# Patient Record
Sex: Female | Born: 1990 | Race: White | Hispanic: No | Marital: Married | Smoking: Current some day smoker
Health system: Southern US, Community
[De-identification: ages and names within clinical notes are randomized; demographics above are authoritative.]

## PROBLEM LIST (undated history)

## (undated) DIAGNOSIS — F4310 Post-traumatic stress disorder, unspecified: Secondary | ICD-10-CM

## (undated) DIAGNOSIS — F909 Attention-deficit hyperactivity disorder, unspecified type: Secondary | ICD-10-CM

## (undated) DIAGNOSIS — F1911 Other psychoactive substance abuse, in remission: Secondary | ICD-10-CM

## (undated) HISTORY — DX: Post-traumatic stress disorder, unspecified: F43.10

## (undated) HISTORY — DX: Attention-deficit hyperactivity disorder, unspecified type: F90.9

## (undated) HISTORY — DX: Other psychoactive substance abuse, in remission: F19.11

---
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
## 2014-08-07 HISTORY — PX: HAND SURGERY: SHX662

## 2016-08-07 HISTORY — PX: WISDOM TOOTH EXTRACTION: SHX21

## 2018-06-28 LAB — CHG URINE PREGNANCY TEST VISUAL COLOR CMPRSN METHS: Preg Test, Ur: POSITIVE

## 2018-08-05 LAB — OB RESULTS CONSOLE HEPATITIS B SURFACE ANTIGEN
Hepatitis B Surface Ag: NEGATIVE
Hepatitis B Surface Ag: NEGATIVE

## 2018-08-05 LAB — OB RESULTS CONSOLE VARICELLA ZOSTER ANTIBODY, IGG
Varicella: IMMUNE
Varicella: NON-IMMUNE/NOT IMMUNE

## 2018-08-05 LAB — HEPATITIS C ANTIBODY: Hepatitis C Ab: POSITIVE

## 2018-08-05 LAB — OB RESULTS CONSOLE PLATELET COUNT: PLATELETS: 240

## 2018-08-05 LAB — OB RESULTS CONSOLE HIV ANTIBODY (ROUTINE TESTING)
HIV: NONREACTIVE
HIV: NONREACTIVE

## 2018-08-05 LAB — OB RESULTS CONSOLE ABO/RH: RH Type: POSITIVE

## 2018-08-05 LAB — OB RESULTS CONSOLE ANTIBODY SCREEN: Antibody Screen: NEGATIVE

## 2018-08-05 LAB — OB RESULTS CONSOLE RUBELLA ANTIBODY, IGM
Rubella: IMMUNE
Rubella: NON-IMMUNE/NOT IMMUNE

## 2018-08-05 LAB — OB RESULTS CONSOLE HGB/HCT, BLOOD
HCT: 38 (ref 29–41)
HCT: 38 (ref 29–41)
Hemoglobin: 12.7
Hemoglobin: 12.7

## 2018-08-05 LAB — OB RESULTS CONSOLE GC/CHLAMYDIA
CHLAMYDIA, DNA PROBE: NEGATIVE
Chlamydia: NEGATIVE
Gonorrhea: NEGATIVE
Gonorrhea: NEGATIVE

## 2018-08-05 LAB — OB RESULTS CONSOLE RPR
RPR: NONREACTIVE
RPR: NONREACTIVE

## 2018-08-07 NOTE — L&D Delivery Note (Addendum)
OB/GYN Faculty Practice Delivery Note  Amanda Buchanan is a 28 y.o. G3P1011 s/p induced vaginal at [redacted]w[redacted]d for FGR  GBS Status: Positive (06/16 0000) Maximum Maternal Temperature: Temp (24hrs), Avg:98.2 F (36.8 C), Min:98 F (36.7 C), Max:98.6 F (37 C)  Labor Progress: . Admitted for IOL . cytotec x 2 . AROM 6h 84m prior to delivery with clear fluid . Placed IUPC  . Complete dilation achieved.   Delivery Date/Time: 02/02/2019 at 0033 Delivery: Called to room and patient was complete and pushing. Head delivered ROA. No nuchal cord present. Shoulder and body delivered in usual fashion. Infant with spontaneous cry, placed skin to skin on mother's abdomen, dried and stimulated. Cord clamped x 2 after 1-minute delay, and cut by patient's mother. Cord blood drawn. Placenta delivered spontaneously with gentle cord traction. Fundus firm with massage and Pitocin. Labia, perineum, vagina, and cervix inspected with 1st degree perineal and a right periurethral laceration, neither of which required repair.  Placenta: spontaneous, intact, 3-vessel cord - noted marginal versus velamentous cord insertion - sent to pathology  Complications: IUGR Lacerations: none EBL: 171 mL  Analgesia: Epidural anesthesia  Postpartum Planning Mom and baby to mother/baby.  . Lactation consult, breast   Infant: Viable female  APGAR: 9/9  2146g  [name], M.D.  02/02/2019 12:55 AM  Attestation: I was present for the entire delivery. Lacerations do not need repair. Adequately treated GBS.  Lambert Mody. Juleen China, DO OB/GYN Fellow

## 2018-10-08 ENCOUNTER — Encounter: Payer: Self-pay | Admitting: Family Medicine

## 2018-10-22 ENCOUNTER — Encounter: Payer: Self-pay | Admitting: *Deleted

## 2018-10-25 ENCOUNTER — Encounter: Payer: Self-pay | Admitting: *Deleted

## 2018-10-28 ENCOUNTER — Encounter: Payer: Medicaid Other | Admitting: Advanced Practice Midwife

## 2018-10-29 ENCOUNTER — Telehealth: Payer: Self-pay | Admitting: Student

## 2018-10-29 ENCOUNTER — Telehealth: Payer: Self-pay | Admitting: Obstetrics & Gynecology

## 2018-10-29 NOTE — Telephone Encounter (Signed)
Called to rescheduled the cancelled OB appointment.

## 2018-10-29 NOTE — Telephone Encounter (Signed)
The patient left a message with a tele center to reschedule her current appointment. Cancelling the appointment and also left a message on the patient's voicemail to call our clinic.

## 2018-11-04 ENCOUNTER — Encounter: Payer: Medicaid Other | Admitting: Student

## 2018-11-14 ENCOUNTER — Telehealth: Payer: Self-pay | Admitting: Obstetrics and Gynecology

## 2018-11-14 NOTE — Telephone Encounter (Signed)
Contacted the patient to inform of upcoming vitural visit. Informed the patient of the webex app and how to down load. She does not have access to blood pressure cuff. Ensure the patient I will send a message to the nurse. She may be able to pick up some cuffs on Monday after the appointment. Agreed to the vitural visit on Monday.

## 2018-11-18 ENCOUNTER — Ambulatory Visit: Payer: Medicaid Other | Admitting: Obstetrics and Gynecology

## 2018-11-18 ENCOUNTER — Encounter: Payer: Self-pay | Admitting: Obstetrics and Gynecology
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
## 2018-11-21 ENCOUNTER — Telehealth: Payer: Self-pay | Admitting: Family Medicine

## 2018-11-21 NOTE — Telephone Encounter (Signed)
Called patient about her appointment on 04/20. Per Dr Shawnie Pons she is to be a virtual visit. I have requested her labs and ultrasound per Pratt's request. She has been informed to ask the provider about her glucose test, as well as another ultrasound.

## 2018-11-25 ENCOUNTER — Other Ambulatory Visit: Payer: Self-pay

## 2018-11-25 ENCOUNTER — Encounter: Payer: Self-pay | Admitting: Obstetrics and Gynecology

## 2018-11-25 ENCOUNTER — Ambulatory Visit (INDEPENDENT_AMBULATORY_CARE_PROVIDER_SITE_OTHER): Payer: Medicaid Other | Admitting: Obstetrics and Gynecology

## 2018-11-25 ENCOUNTER — Telehealth: Payer: Self-pay | Admitting: Family Medicine

## 2018-11-25 ENCOUNTER — Telehealth: Payer: Self-pay

## 2018-11-25 VITALS — Ht 63.0 in

## 2018-11-25 DIAGNOSIS — Z3A28 28 weeks gestation of pregnancy: Secondary | ICD-10-CM

## 2018-11-25 DIAGNOSIS — Z3483 Encounter for supervision of other normal pregnancy, third trimester: Secondary | ICD-10-CM | POA: Diagnosis not present

## 2018-11-25 DIAGNOSIS — Z3480 Encounter for supervision of other normal pregnancy, unspecified trimester: Secondary | ICD-10-CM | POA: Insufficient documentation

## 2018-11-25 NOTE — Telephone Encounter (Signed)
LVM for patient to advise of U/S scheduled for tomorrow at 330pm 11/26/2018

## 2018-11-25 NOTE — Progress Notes (Signed)
I connected with  Amanda Buchanan on 11/25/18 at  1:35 PM EDT by telephone and verified that I am speaking with the correct person using two identifiers.   I discussed the limitations, risks, security and privacy concerns of performing an evaluation and management service by telephone and the availability of in person appointments. I also discussed with the patient that there may be a patient responsible charge related to this service. The patient expressed understanding and agreed to proceed. States weight 127lb 2 weeks ago at her Mom's. Send patient babyscripts app and talked her through signing up for app and for Mommy Kit. Instructed to take blood pressure weekly and enter into app. Call us if bp 140/90 or  Higher.  Su Ley, RN 11/25/2018  1:41 PM

## 2018-11-25 NOTE — Telephone Encounter (Signed)
Called patient to get her scheduled for her follow-up appointments. Appointments were made and patient verbalized understanding that her next ob visit will be virtual and she is coming in tomorrow ( 4/21) to complete 2 hr gtt before ultrasound.

## 2018-11-25 NOTE — Progress Notes (Signed)
.     TELEHEALTH VIRTUAL OBSTETRICS VISIT ENCOUNTER NOTE  I connected with Amanda Buchanan on 11/25/18 at  1:35 PM EDT by telephone at home and verified that I am speaking with the correct person using two identifiers.   I discussed the limitations, risks, security and privacy concerns of performing an evaluation and management service by telephone and the availability of in person appointments. I also discussed with the patient that there may be a patient responsible charge related to this service. The patient expressed understanding and agreed to proceed.  Subjective:  Amanda Buchanan is a 28 y.o. G3P1011 at [redacted]w[redacted]d being followed for her first OB visit. EDD by certain LMP. TSVD in 2018 without problems. No chronic medical problems or medication. Last pap 2018 normal per pt.   She is currently monitored for the following issues for this low-risk pregnancy and has Supervision of other normal pregnancy, antepartum on their problem list.  Patient reports no complaints. Reports fetal movement. Denies any contractions, bleeding or leaking of fluid.   The following portions of the patient's history were reviewed and updated as appropriate: allergies, current medications, past family history, past medical history, past social history, past surgical history and problem list.   Objective:   General:  Alert, oriented and cooperative.   Mental Status: Normal mood and affect perceived. Normal judgment and thought content.  Rest of physical exam deferred due to type of encounter  Assessment and Plan:  Pregnancy: G3P1011 at [redacted]w[redacted]d 1. Supervision of other normal pregnancy, antepartum Stable Prenatal care reviewed. Prenatal las in chart Antomy scan ordered [facility] ordered Glucola this week - [facility] AMB BABYSCRIPTS SCHEDULE OPTIMIZATION - [place] MFM OB COMP + 14 WK; Future  Preterm labor symptoms and general obstetric precautions including but not limited to vaginal bleeding, contractions, leaking of fluid and  fetal movement were reviewed in detail with the patient.  I discussed the assessment and treatment plan with the patient. The patient was provided an opportunity to ask questions and all were answered. The patient agreed with the plan and demonstrated an understanding of the instructions. The patient was advised to call back or seek an in-person office evaluation/go to [facility] at [facility] for any urgent or concerning symptoms. Please refer to After Visit Summary for other counseling recommendations.   I provided 15 minutes of non-face-to-face time during this encounter.  Return in about 4 weeks (around 12/23/2018) for OB visit, televisit, lab appt only this week for glucola.  Future Appointments  Date Time Provider Department Center  11/26/2018  3:30 PM [facility] [place] 1 WH-[facility] M[facility]-US    Hermina Staggers, MD Center for [facility], [facility]

## 2018-11-26 ENCOUNTER — Other Ambulatory Visit: Payer: Self-pay | Admitting: General Practice

## 2018-11-26 ENCOUNTER — Other Ambulatory Visit: Payer: Medicaid Other

## 2018-11-26 ENCOUNTER — Ambulatory Visit (HOSPITAL_COMMUNITY)
Admission: RE | Admit: 2018-11-26 | Discharge: 2018-11-26 | Disposition: A | Discharge status: Home / Self Care | Payer: Medicaid Other | Source: Ambulatory Visit | Attending: Obstetrics and Gynecology | Admitting: Obstetrics and Gynecology

## 2018-11-26 ENCOUNTER — Other Ambulatory Visit: Payer: Self-pay

## 2018-11-26 DIAGNOSIS — O0932 Supervision of pregnancy with insufficient antenatal care, second trimester: Secondary | ICD-10-CM

## 2018-11-26 DIAGNOSIS — Z3A28 28 weeks gestation of pregnancy: Secondary | ICD-10-CM

## 2018-11-26 DIAGNOSIS — Z363 Encounter for antenatal screening for malformations: Secondary | ICD-10-CM | POA: Diagnosis not present

## 2018-11-26 DIAGNOSIS — Z3480 Encounter for supervision of other normal pregnancy, unspecified trimester: Secondary | ICD-10-CM | POA: Diagnosis present

## 2018-11-26 IMAGING — US US MFM OB COMP + 14 WK
1 series · 13 of 28 positions shown · non-contrast
Comparison: none

[Series 1: us mfm ob comp + 14 wk · 13 of 74 slices shown]
[im 3/74]
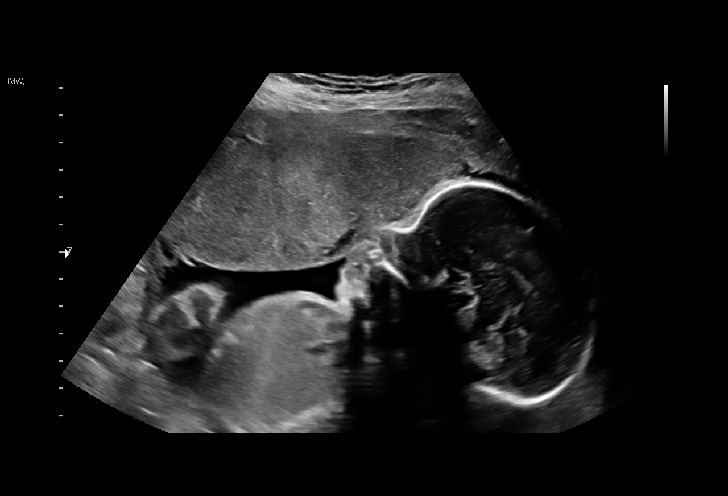
[im 9/74]
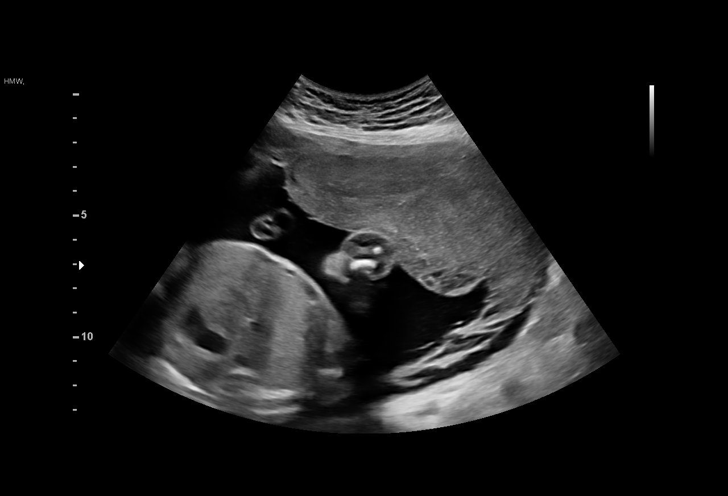
[im 14/74]
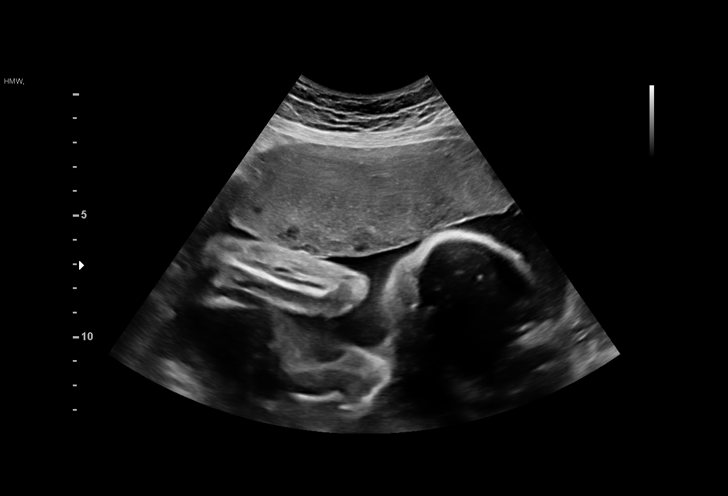
[im 19/74]
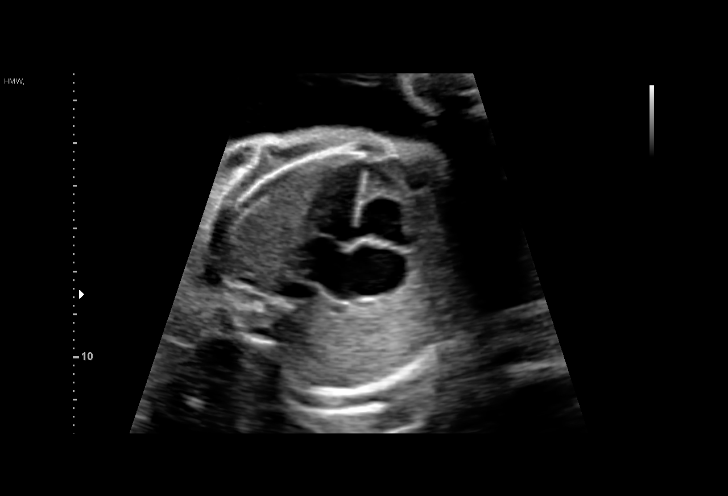
[im 25/74]
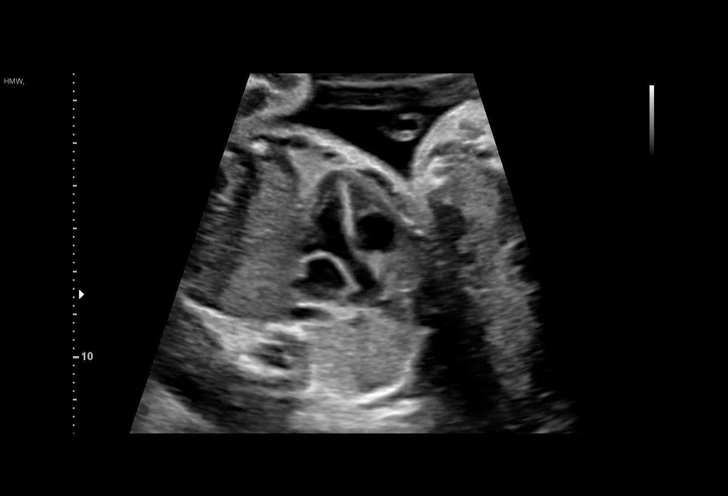
[im 30/74]
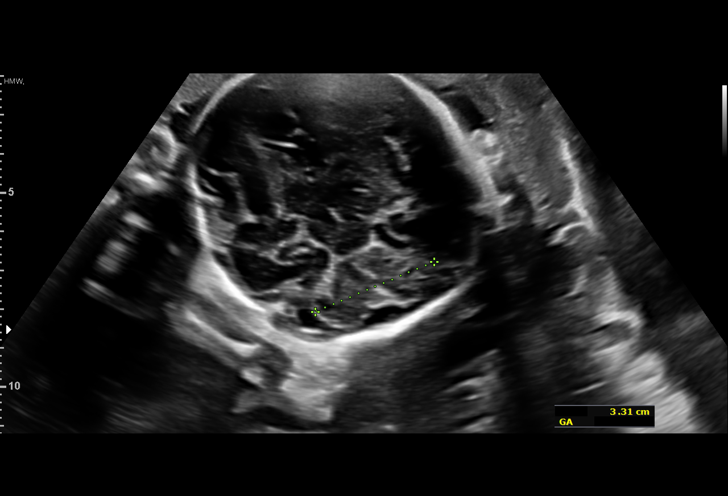
[im 38/74]
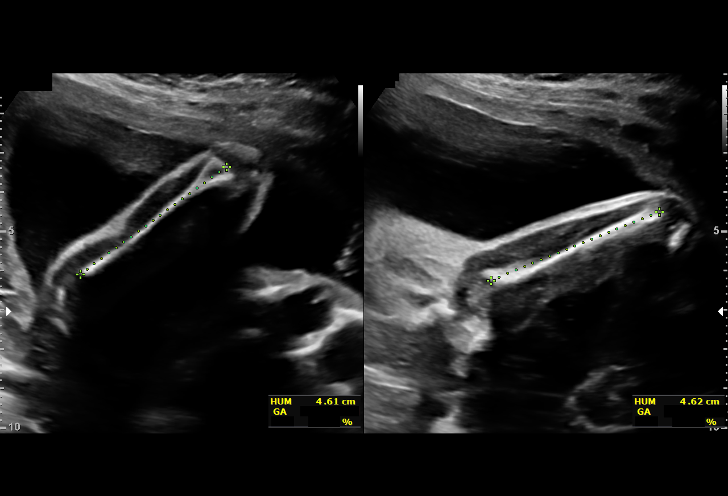
[im 44/74]
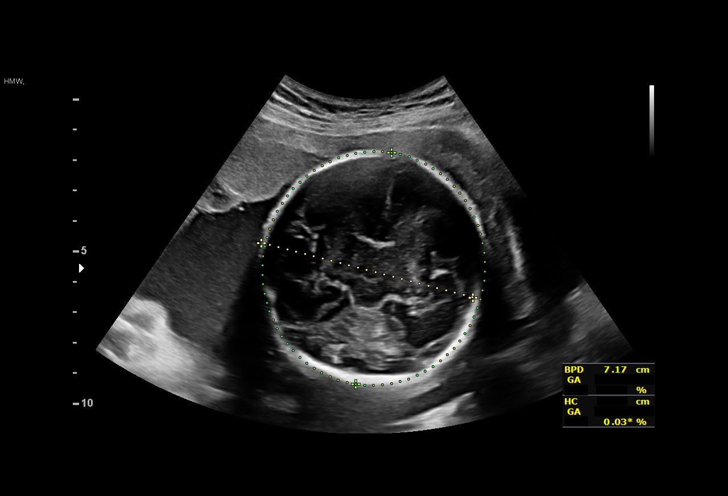
[im 49/74]
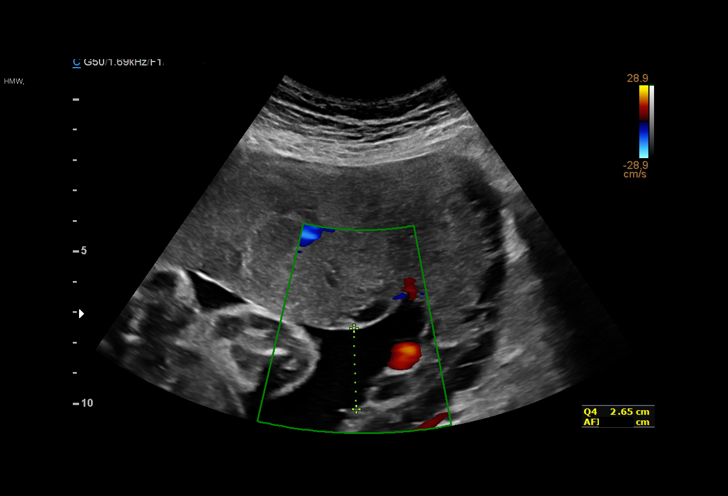
[im 55/74]
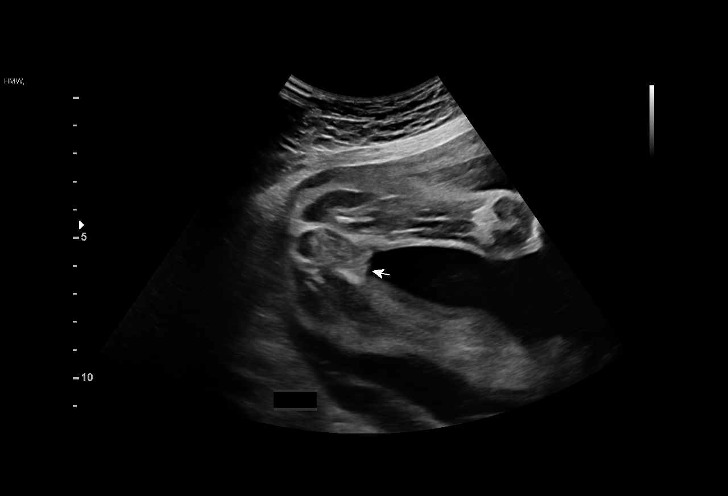
[im 60/74]
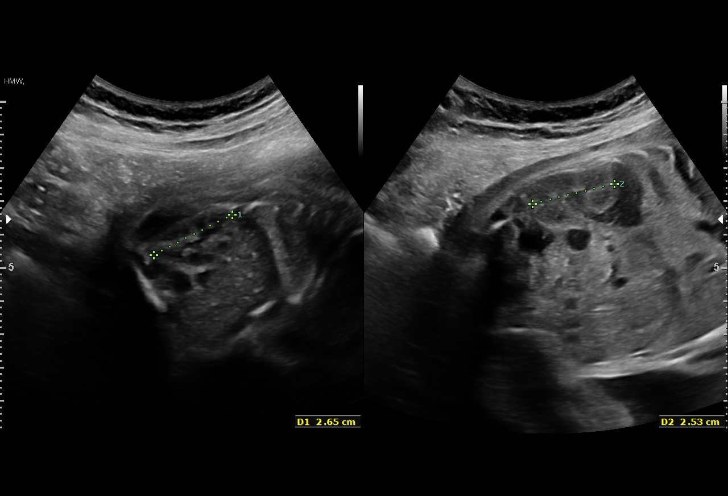
[im 65/74]
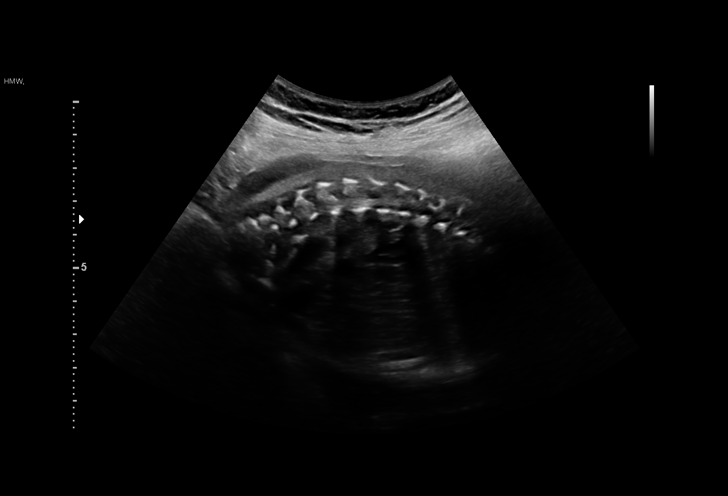
[im 71/74]
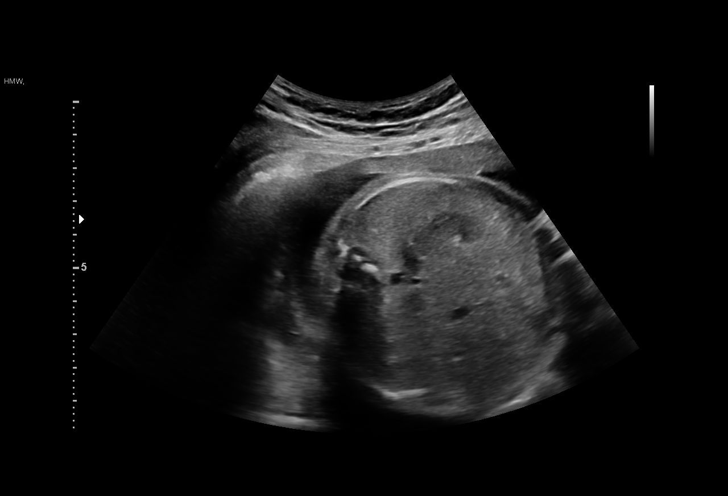

[13 of 28 positions shown; findings below may reference images not displayed]

[address]

  1  US MFM OB COMP + 14 WK               76805.01     NURISALO
 ----------------------------------------------------------------------

 ----------------------------------------------------------------------
Indications

  Encounter for antenatal screening for          [66]
  malformations ( no testing in chart)
  Late prenatal care, second trimester (26       [66]
  weeks)
  28 weeks gestation of pregnancy
 ----------------------------------------------------------------------
Fetal Evaluation

 Num Of Fetuses:          1
 Fetal Heart Rate(bpm):   157
 Cardiac Activity:        Observed
 Presentation:            Cephalic
 Placenta:                Left lateral
 P. Cord Insertion:       Marginal insertion

 Amniotic Fluid
 AFI FV:      Within normal limits

 AFI Sum(cm)     %Tile       Largest Pocket(cm)
 11.29           22

 RUQ(cm)       RLQ(cm)       LUQ(cm)        LLQ(cm)

Biometry

 BPD:      72.6  mm     G. Age:  29w 1d         61  %    CI:            87  %    70 - 86
                                                         FL/HC:       20.4  %    18.8 -
 HC:       245   mm     G. Age:  26w 4d        < 3  %    HC/AC:       1.04       1.05 -
 AC:      234.5  mm     G. Age:  27w 5d         24  %    FL/BPD:      68.9  %    71 - 87
 FL:         50  mm     G. Age:  26w 6d          6  %    FL/AC:       21.3  %    20 - 24
 HUM:      46.2  mm     G. Age:  27w 2d         20  %
 CER:      33.1  mm     G. Age:  29w 0d         58  %
 CM:        4.7  mm
 Est. FW:    [66]   gm     2 lb 6 oz     31  %
OB History

 Gravidity:    3         Term:   1        Prem:   0        SAB:   1
 TOP:          0       Ectopic:  0        Living: 1
Gestational Age

 LMP:           28w 3d        Date:  [DATE]                 EDD:   [DATE]
 U/S Today:     27w 4d                                        EDD:   [DATE]
 Best:          28w 3d     Det. By:  LMP  ([DATE])          EDD:   [DATE]
Anatomy

 Cranium:               Appears normal         LVOT:                   Appears normal
 Cavum:                 Appears normal         Diaphragm:              Appears normal
 Ventricles:            Appears normal         Stomach:                Appears normal, left
                                                                       sided
 Choroid Plexus:        Appears normal         Abdomen:                Appears normal
 Cerebellum:            Appears normal         Abdominal Wall:         Appears nml (cord
                                                                       insert, abd wall)
 Posterior Fossa:       Appears normal         Cord Vessels:           Appears normal (3
                                                                       vessel cord)
 Nuchal Fold:           Not applicable (>20    Kidneys:                Appear normal
                        wks GA)
 Face:                  Appears normal         Bladder:                Appears normal
                        (orbits and profile)
 Lips:                  Appears normal         Spine:                  Appears normal
 Thoracic:              Appears normal         Upper Extremities:      Appears normal
 Heart:                 Appears normal         Lower Extremities:      Appears normal
                        (4CH, axis, and
                        situs)

 Other:  Fetus appears to be female. Heels visualized.
Cervix Uterus Adnexa

 Cervix
 Not visualized (advanced GA >[66])

 Uterus
 No abnormality visualized.

 Left Ovary
 No adnexal mass visualized.

 Right Ovary
 No adnexal mass visualized.

 Cul De Sac
 No free fluid seen.

 Adnexa
 No abnormality visualized.
Impression

 On ultrasound, the fetal growth is appropriate for the
 gestational age. Amniotic fluid is normal and good fetal
 activity is seen. Fetal anatomy appears normal, but limited
 because of advanced gestational age.
Recommendations

 An appointment was made for her to return in 4 weeks for
 completion of anatomy and fetal growth assessment.
                 NURISALO

## 2018-11-27 ENCOUNTER — Other Ambulatory Visit (HOSPITAL_COMMUNITY): Payer: Self-pay | Admitting: *Deleted

## 2018-11-27 DIAGNOSIS — Z362 Encounter for other antenatal screening follow-up: Secondary | ICD-10-CM

## 2018-11-27 LAB — RPR: RPR Ser Ql: NONREACTIVE

## 2018-11-27 LAB — CBC
Hematocrit: 29.7 % — ABNORMAL LOW (ref 34.0–46.6)
Hemoglobin: 9.9 g/dL — ABNORMAL LOW (ref 11.1–15.9)
MCH: 31.5 pg (ref 26.6–33.0)
MCHC: 33.3 g/dL (ref 31.5–35.7)
MCV: 95 fL (ref 79–97)
Platelets: 203 10*3/uL (ref 150–450)
RBC: 3.14 x10E6/uL — ABNORMAL LOW (ref 3.77–5.28)
RDW: 12.8 % (ref 11.7–15.4)
WBC: 8.6 10*3/uL (ref 3.4–10.8)

## 2018-11-27 LAB — GLUCOSE TOLERANCE, 2 HOURS W/ 1HR
Glucose, 1 hour: 102 mg/dL (ref 65–179)
Glucose, 2 hour: 97 mg/dL (ref 65–152)
Glucose, Fasting: 79 mg/dL (ref 65–91)

## 2018-11-27 LAB — HIV ANTIBODY (ROUTINE TESTING W REFLEX): HIV Screen 4th Generation wRfx: NONREACTIVE

## 2018-11-28 ENCOUNTER — Telehealth (INDEPENDENT_AMBULATORY_CARE_PROVIDER_SITE_OTHER): Payer: Medicaid Other | Admitting: *Deleted

## 2018-11-28 DIAGNOSIS — D649 Anemia, unspecified: Secondary | ICD-10-CM

## 2018-11-28 MED ORDER — FERROUS SULFATE 325 (65 FE) MG PO TABS: 325.0000 mg | ORAL_TABLET | Freq: Every day | ORAL | 3 refills | Status: AC

## 2018-11-28 NOTE — Telephone Encounter (Signed)
-----   Message from Hermina Staggers, MD sent at 11/28/2018 11:30 AM EDT ----- Please let pt know that she past her Glucola but is anemic. Send Rx for iron supplement qd to pharmacy  Thanks Casimiro Needle

## 2018-11-28 NOTE — Telephone Encounter (Signed)
Called pt to inform her that she passed her 2 hour glucose test but is anemic and her provider is sending a prescription for iron to the [facility] on [address].  Pt advised to take the iron daily.  Pt verbalized understanding.  Prescription for iron sent to pt's pharmacy.

## 2018-12-02 ENCOUNTER — Telehealth: Payer: Self-pay | Admitting: Clinical

## 2018-12-02 NOTE — Telephone Encounter (Signed)
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 Pt does not want a referral for therapy or treatment. Pt self-reports positive Hep C.   Florentina Addison will be on leave starting 12/03/2018; Crystal from Pregnancy Case Management  will take over pt's case management. Crystal can be reached at [phone].

## 2018-12-23 ENCOUNTER — Encounter: Payer: Medicaid Other | Admitting: Advanced Practice Midwife

## 2018-12-23 ENCOUNTER — Telehealth: Payer: Self-pay | Admitting: Clinical

## 2018-12-23 NOTE — Telephone Encounter (Signed)
Crystal, Pregnancy Care Manager, left a message on 12/20/2018, requesting follow-up information concerning patient history and medical conditions, and requests call back at [phone] or [phone].

## 2018-12-24 ENCOUNTER — Ambulatory Visit (HOSPITAL_COMMUNITY): Payer: Medicaid Other | Admitting: *Deleted

## 2018-12-24 ENCOUNTER — Other Ambulatory Visit: Payer: Self-pay

## 2018-12-24 ENCOUNTER — Other Ambulatory Visit (HOSPITAL_COMMUNITY): Payer: Self-pay | Admitting: *Deleted

## 2018-12-24 ENCOUNTER — Encounter (HOSPITAL_COMMUNITY): Payer: Self-pay

## 2018-12-24 ENCOUNTER — Ambulatory Visit (HOSPITAL_COMMUNITY)
Admission: RE | Admit: 2018-12-24 | Discharge: 2018-12-24 | Disposition: A | Discharge status: Home / Self Care | Payer: Medicaid Other | Source: Ambulatory Visit | Attending: Obstetrics and Gynecology | Admitting: Obstetrics and Gynecology

## 2018-12-24 VITALS — BP 102/53 | HR 73

## 2018-12-24 DIAGNOSIS — O365930 Maternal care for other known or suspected poor fetal growth, third trimester, not applicable or unspecified: Secondary | ICD-10-CM | POA: Insufficient documentation

## 2018-12-24 DIAGNOSIS — O0932 Supervision of pregnancy with insufficient antenatal care, second trimester: Secondary | ICD-10-CM | POA: Diagnosis not present

## 2018-12-24 DIAGNOSIS — Z3A32 32 weeks gestation of pregnancy: Secondary | ICD-10-CM | POA: Diagnosis not present

## 2018-12-24 DIAGNOSIS — Z362 Encounter for other antenatal screening follow-up: Secondary | ICD-10-CM | POA: Diagnosis present

## 2018-12-24 NOTE — Procedures (Signed)
Amanda Buchanan 07/15/1991 [redacted]w[redacted]d  Fetus A Non-Stress Test Interpretation for 12/24/18  Indication: IUGR  Fetal Heart Rate A Mode: External Baseline Rate (A): 125 bpm Variability: Moderate Accelerations: 15 x 15 Decelerations: None Multiple birth?: No  Uterine Activity Mode: Toco Contraction Frequency (min): none noted.  Interpretation (Fetal Testing) Nonstress Test Interpretation: Reactive Comments: FHR tracing rev'd by Dr. Grace Bushy

## 2018-12-25 ENCOUNTER — Other Ambulatory Visit (HOSPITAL_COMMUNITY): Payer: Self-pay | Admitting: *Deleted

## 2018-12-25 DIAGNOSIS — O365930 Maternal care for other known or suspected poor fetal growth, third trimester, not applicable or unspecified: Secondary | ICD-10-CM

## 2018-12-26 ENCOUNTER — Other Ambulatory Visit: Payer: Self-pay

## 2018-12-26 ENCOUNTER — Telehealth (INDEPENDENT_AMBULATORY_CARE_PROVIDER_SITE_OTHER): Payer: Medicaid Other | Admitting: Obstetrics & Gynecology

## 2018-12-26 VITALS — BP 94/66 | Wt 136.0 lb

## 2018-12-26 DIAGNOSIS — O365990 Maternal care for other known or suspected poor fetal growth, unspecified trimester, not applicable or unspecified: Secondary | ICD-10-CM

## 2018-12-26 DIAGNOSIS — Z3480 Encounter for supervision of other normal pregnancy, unspecified trimester: Secondary | ICD-10-CM

## 2018-12-26 DIAGNOSIS — Z3A32 32 weeks gestation of pregnancy: Secondary | ICD-10-CM

## 2018-12-26 DIAGNOSIS — Z3483 Encounter for supervision of other normal pregnancy, third trimester: Secondary | ICD-10-CM

## 2018-12-26 MED ORDER — VALACYCLOVIR HCL 500 MG PO TABS: 500.0000 mg | ORAL_TABLET | Freq: Two times a day (BID) | ORAL | 6 refills | Status: AC

## 2018-12-26 NOTE — Progress Notes (Signed)
TELEHEALTH OBSTETRICS PRENATAL VIRTUAL VIDEO VISIT ENCOUNTER NOTE  Provider location: [facility] for [employer] at [facility]   I connected with Amanda Buchanan on 12/26/18 at 10:15 AM EDT by MyChart Video Encounter at home and verified that I am speaking with the correct person using two identifiers.   I discussed the limitations, risks, security and privacy concerns of performing an evaluation and management service by telephone and the availability of in person appointments. I also discussed with the patient that there may be a patient responsible charge related to this service. The patient expressed understanding and agreed to proceed. Subjective:  Amanda Buchanan is a 28 y.o. G3P1011 at [redacted]w[redacted]d being seen today for ongoing prenatal care.  She is currently monitored for the following issues for this high-risk pregnancy and has Supervision of other normal pregnancy, antepartum and Pregnancy affected by fetal growth restriction on their problem list.  Patient reports no complaints.  Contractions: Irritability. Vag. Bleeding: None.  Movement: Present. Denies any leaking of fluid.   The following portions of the patient's history were reviewed and updated as appropriate: allergies, current medications, past family history, past medical history, past social history, past surgical history and problem list.   Objective:   Vitals:   12/26/18 1025  BP: 94/66  Weight: 136 lb (61.7 kg)    Fetal Status:     Movement: Present     General:  Alert, oriented and cooperative. Patient is in no acute distress.  Respiratory: Normal respiratory effort, no problems with respiration noted  Mental Status: Normal mood and affect. Normal behavior. Normal judgment and thought content.  Rest of physical exam deferred due to type of encounter  Imaging: [place] Mfm Ob Comp + 14 Wk  Result Date: 11/26/2018 ----------------------------------------------------------------------  OBSTETRICS REPORT                        (Signed Final 11/26/2018 04:54 pm) ---------------------------------------------------------------------- Patient Info  ID #:       [id number]                          D.O.B.:  10/06/1990 (28 yrs)  Name:       Amanda Buchanan                   Visit Date: 11/26/2018 04:08 pm ---------------------------------------------------------------------- Performed By  Performed By:     Percell Boston          Ref. Address:      [address]                    [place]                                                              [address]  Attending:        Noralee Space MD        [facility]  Referred By:      [facility] ---------------------------------------------------------------------- Orders   #  Description                          Code         Ordered By   1  [place] MFM OB COMP + 14 WK               76805.01     MICHAEL ERVIN  ----------------------------------------------------------------------   #  Order #                    Accession #                 Episode #   1  [id number]                  [id number]                  [id number]  ---------------------------------------------------------------------- Indications   Encounter for antenatal screening for          Z36.3   malformations ( no testing in chart)   Late prenatal care, second trimester (26       O09.32   weeks)   [redacted] weeks gestation of pregnancy                Z3A.28  ---------------------------------------------------------------------- Fetal Evaluation  Num Of Fetuses:          1  Fetal Heart Rate(bpm):   157  Cardiac Activity:        Observed  Presentation:            Cephalic  Placenta:                Left lateral  P. Cord Insertion:       Marginal insertion  Amniotic Fluid  AFI FV:      Within normal limits  AFI Sum(cm)     %Tile       Largest Pocket(cm)  11.29           22          3.23  RUQ(cm)       RLQ(cm)       LUQ(cm)        LLQ(cm)  3.16          2.65           2.25           3.23 ---------------------------------------------------------------------- Biometry  BPD:      72.6  mm     G. Age:  29w 1d         61  %    CI:            87  %    70 - 86                                                          FL/HC:       20.4  %    18.8 - 20.6  HC:       245   mm     G. Age:  26w 4d        < 3  %  HC/AC:       1.04       1.05 - 1.21  AC:      234.5  mm     G. Age:  27w 5d         24  %    FL/BPD:      68.9  %    71 - 87  FL:         50  mm     G. Age:  26w 6d          6  %    FL/AC:       21.3  %    20 - 24  HUM:      46.2  mm     G. Age:  27w 2d         20  %  CER:      33.1  mm     G. Age:  29w 0d         58  %  CM:        4.7  mm  Est. FW:    1076   gm     2 lb 6 oz     31  % ---------------------------------------------------------------------- OB History  Gravidity:    3         Term:   1        Prem:   0        SAB:   1  TOP:          0       Ectopic:  0        Living: 1 ---------------------------------------------------------------------- Gestational Age  LMP:           28w 3d        Date:  05/11/18                 EDD:   02/15/19  U/S Today:     27w 4d                                        EDD:   02/21/19  Best:          28w 3d     Det. By:  LMP  (05/11/18)          EDD:   02/15/19 ---------------------------------------------------------------------- Anatomy  Cranium:               Appears normal         LVOT:                   Appears normal  Cavum:                 Appears normal         Diaphragm:              Appears normal  Ventricles:            Appears normal         Stomach:                Appears normal, left  sided  Choroid Plexus:        Appears normal         Abdomen:                Appears normal  Cerebellum:            Appears normal         Abdominal Wall:         Appears nml (cord                                                                        insert, abd wall)  Posterior  Fossa:       Appears normal         Cord Vessels:           Appears normal (3                                                                        vessel cord)  Nuchal Fold:           Not applicable (>20    Kidneys:                Appear normal                         wks GA)  Face:                  Appears normal         Bladder:                Appears normal                         (orbits and profile)  Lips:                  Appears normal         Spine:                  Appears normal  Thoracic:              Appears normal         Upper Extremities:      Appears normal  Heart:                 Appears normal         Lower Extremities:      Appears normal                         (4CH, axis, and                         situs)  Other:  Fetus appears to be female. Heels visualized. ---------------------------------------------------------------------- Cervix Uterus Adnexa  Cervix  Not visualized (advanced GA >24wks)  Uterus  No abnormality visualized.  Left Ovary  No adnexal  mass visualized.  Right Ovary  No adnexal mass visualized.  Cul De Sac  No free fluid seen.  Adnexa  No abnormality visualized. ---------------------------------------------------------------------- Impression  On ultrasound, the fetal growth is appropriate for the  gestational age. Amniotic fluid is normal and good fetal  activity is seen. Fetal anatomy appears normal, but limited  because of advanced gestational age. ---------------------------------------------------------------------- Recommendations  An appointment was made for her to return in 4 weeks for  completion of anatomy and fetal growth assessment. ----------------------------------------------------------------------                  Noralee Space, MD Electronically Signed Final Report   11/26/2018 04:54 pm ----------------------------------------------------------------------  [place] Mfm Ob Follow Up  Result Date:  12/24/2018 ----------------------------------------------------------------------  OBSTETRICS REPORT                       (Signed Final 12/24/2018 04:59 pm) ---------------------------------------------------------------------- Patient Info  ID #:       [id number]                          D.O.B.:  Jul 24, 1991 (28 yrs)  Name:       Amanda Buchanan                   Visit Date: 12/24/2018 01:37 pm ---------------------------------------------------------------------- Performed By  Performed By:     Lenise Arena        Ref. Address:     [address]                    RD[place]                                                             [address]  Attending:        Lin Landsman      [facility]  Referred By:      [facility] ---------------------------------------------------------------------- Orders   #  Description                          Code         Ordered By   1  [place] MFM OB FOLLOW UP                  62694.85     Noralee Space  ----------------------------------------------------------------------   #  Order #                    Accession #                 Episode #   1  [id number]                  [id number]  [id number]  ---------------------------------------------------------------------- Indications   Late prenatal care, second trimester (26       O09.32   weeks)   Encounter for other antenatal screening        Z36.2   follow-up   [redacted] weeks gestation of pregnancy                Z3A.32  ---------------------------------------------------------------------- Fetal Evaluation  Num Of Fetuses:         1  Cardiac Activity:       Observed  Presentation:           Cephalic  Placenta:               Anterior Fundal  P. Cord Insertion:      Previously Visualized  Amniotic Fluid  AFI FV:      Within normal limits  AFI Sum(cm)     %Tile       Largest Pocket(cm)  11.07           25          5.13  RUQ(cm)       RLQ(cm)        LUQ(cm)        LLQ(cm)  5.13          2.51          0              3.43 ---------------------------------------------------------------------- Biometry  BPD:      79.1  mm     G. Age:  31w 5d         23  %    CI:        76.89   %    70 - 86                                                          FL/HC:      21.1   %    19.1 - 21.3  HC:      285.7  mm     G. Age:  31w 3d          3  %    HC/AC:      1.08        0.96 - 1.17  AC:      263.5  mm     G. Age:  30w 3d          7  %    FL/BPD:     76.4   %    71 - 87  FL:       60.4  mm     G. Age:  31w 3d         15  %    FL/AC:      22.9   %    20 - 24  HUM:      51.9  mm     G. Age:  30w 2d         10  %  LV:        2.5  mm  Est. FW:    1673  gm    3 lb 11 oz      29  % ---------------------------------------------------------------------- OB History  Gravidity:    3  Term:   1        Prem:   0        SAB:   1  TOP:          0       Ectopic:  0        Living: 1 ---------------------------------------------------------------------- Gestational Age  LMP:           32w 3d        Date:  05/11/18                 EDD:   02/15/19  U/S Today:     31w 2d                                        EDD:   02/23/19  Best:          32w 3d     Det. By:  LMP  (05/11/18)          EDD:   02/15/19 ---------------------------------------------------------------------- Anatomy  Cranium:               Appears normal         Aortic Arch:            Appears normal  Cavum:                 Appears normal         Ductal Arch:            Appears normal  Ventricles:            Appears normal         Diaphragm:              Appears normal  Choroid Plexus:        Previously seen        Stomach:                Appears normal, left                                                                        sided  Cerebellum:            Previously seen        Abdomen:                Appears normal  Posterior Fossa:       Previously seen        Abdominal Wall:         Previously seen  Nuchal Fold:            Not applicable (>20    Cord Vessels:           Previously seen                         wks GA)  Face:                  Orbits and profile     Kidneys:                Appear normal  previously seen  Lips:                  Previously seen        Bladder:                Appears normal  Thoracic:              Appears normal         Spine:                  Previously seen  Heart:                 Appears normal         Upper Extremities:      Previously seen                         (4CH, axis, and                         situs)  RVOT:                  Appears normal         Lower Extremities:      Previously seen  LVOT:                  Previously seen  Other:  Fetus appears to be female. Heels visualized. ---------------------------------------------------------------------- Doppler - Fetal Vessels  Umbilical Artery   S/D     %tile  4.12    > 97.5 ---------------------------------------------------------------------- Cervix Uterus Adnexa  Cervix  Not visualized (advanced GA >24wks) ---------------------------------------------------------------------- Impression  Fetal growth restriction with elevated UA Dopplers and  normal fluid,  NST performed and was reactive with moderate variability.  EFW 29% with AC 7th%. ---------------------------------------------------------------------- Recommendations  Follow up UA Doppler and BPP in 1 weeks  Repeat growth in 4 weeks.  Continue weekly testing with UA Dopplers  Delivery by 39 weeks. ----------------------------------------------------------------------               Lin Landsman, MD Electronically Signed Final Report   12/24/2018 04:59 pm ----------------------------------------------------------------------   Assessment and Plan:  Pregnancy: G3P1011 at [redacted]w[redacted]d 1. Supervision of other normal pregnancy, antepartum -needs TDAP at next visit  2. Pregnancy affected by fetal growth restriction - dopplers on 12-31-18 and MFM rec delivery by  39 weeks  Preterm labor symptoms and general obstetric precautions including but not limited to vaginal bleeding, contractions, leaking of fluid and fetal movement were reviewed in detail with the patient. I discussed the assessment and treatment plan with the patient. The patient was provided an opportunity to ask questions and all were answered. The patient agreed with the plan and demonstrated an understanding of the instructions. The patient was advised to call back or seek an in-person office evaluation/go to [facility] at [facility] for any urgent or concerning symptoms. Please refer to After Visit Summary for other counseling recommendations.   I provided 10 minutes of face-to-face time during this encounter.  No follow-ups on file.  Future Appointments  Date Time Provider Department Center  12/31/2018 12:30 PM WH-MFC [place] 1 WH-MFCUS MFC-US  01/17/2019  9:45 AM WH-MFC [place] 2 WH-MFCUS MFC-US    Allie Bossier, MD [facility] for [employer], [facility]

## 2018-12-31 ENCOUNTER — Encounter (HOSPITAL_COMMUNITY): Payer: Self-pay

## 2018-12-31 ENCOUNTER — Ambulatory Visit (HOSPITAL_COMMUNITY): Payer: Medicaid Other | Admitting: *Deleted

## 2018-12-31 ENCOUNTER — Other Ambulatory Visit (HOSPITAL_COMMUNITY): Payer: Self-pay | Admitting: Maternal & Fetal Medicine

## 2018-12-31 ENCOUNTER — Ambulatory Visit (HOSPITAL_COMMUNITY)
Admission: RE | Admit: 2018-12-31 | Discharge: 2018-12-31 | Disposition: A | Discharge status: Home / Self Care | Payer: Medicaid Other | Source: Ambulatory Visit | Attending: Obstetrics and Gynecology | Admitting: Obstetrics and Gynecology

## 2018-12-31 ENCOUNTER — Encounter: Payer: Self-pay | Admitting: *Deleted

## 2018-12-31 ENCOUNTER — Other Ambulatory Visit: Payer: Self-pay

## 2018-12-31 VITALS — BP 98/59 | HR 84 | Temp 98.0°F

## 2018-12-31 DIAGNOSIS — B1910 Unspecified viral hepatitis B without hepatic coma: Secondary | ICD-10-CM | POA: Insufficient documentation

## 2018-12-31 DIAGNOSIS — Z3480 Encounter for supervision of other normal pregnancy, unspecified trimester: Secondary | ICD-10-CM

## 2018-12-31 DIAGNOSIS — O99013 Anemia complicating pregnancy, third trimester: Secondary | ICD-10-CM

## 2018-12-31 DIAGNOSIS — Z87898 Personal history of other specified conditions: Secondary | ICD-10-CM

## 2018-12-31 DIAGNOSIS — O0933 Supervision of pregnancy with insufficient antenatal care, third trimester: Secondary | ICD-10-CM | POA: Diagnosis not present

## 2018-12-31 DIAGNOSIS — Z3A33 33 weeks gestation of pregnancy: Secondary | ICD-10-CM

## 2018-12-31 DIAGNOSIS — O98419 Viral hepatitis complicating pregnancy, unspecified trimester: Secondary | ICD-10-CM | POA: Insufficient documentation

## 2018-12-31 DIAGNOSIS — B182 Chronic viral hepatitis C: Secondary | ICD-10-CM

## 2018-12-31 DIAGNOSIS — O365990 Maternal care for other known or suspected poor fetal growth, unspecified trimester, not applicable or unspecified: Secondary | ICD-10-CM | POA: Insufficient documentation

## 2018-12-31 DIAGNOSIS — O365930 Maternal care for other known or suspected poor fetal growth, third trimester, not applicable or unspecified: Secondary | ICD-10-CM | POA: Diagnosis not present

## 2018-12-31 DIAGNOSIS — O98413 Viral hepatitis complicating pregnancy, third trimester: Secondary | ICD-10-CM

## 2018-12-31 DIAGNOSIS — B1920 Unspecified viral hepatitis C without hepatic coma: Secondary | ICD-10-CM | POA: Insufficient documentation

## 2018-12-31 DIAGNOSIS — F1991 Other psychoactive substance use, unspecified, in remission: Secondary | ICD-10-CM

## 2019-01-01 ENCOUNTER — Other Ambulatory Visit (HOSPITAL_COMMUNITY): Payer: Self-pay | Admitting: *Deleted

## 2019-01-01 DIAGNOSIS — O365930 Maternal care for other known or suspected poor fetal growth, third trimester, not applicable or unspecified: Secondary | ICD-10-CM

## 2019-01-07 ENCOUNTER — Other Ambulatory Visit: Payer: Self-pay

## 2019-01-07 ENCOUNTER — Other Ambulatory Visit (HOSPITAL_COMMUNITY): Payer: Self-pay | Admitting: *Deleted

## 2019-01-07 ENCOUNTER — Ambulatory Visit (HOSPITAL_COMMUNITY): Payer: Medicaid Other | Admitting: *Deleted

## 2019-01-07 ENCOUNTER — Ambulatory Visit (HOSPITAL_COMMUNITY)
Admission: RE | Admit: 2019-01-07 | Discharge: 2019-01-07 | Disposition: A | Discharge status: Home / Self Care | Payer: Medicaid Other | Source: Ambulatory Visit | Attending: Obstetrics and Gynecology | Admitting: Obstetrics and Gynecology

## 2019-01-07 ENCOUNTER — Encounter (HOSPITAL_COMMUNITY): Payer: Self-pay

## 2019-01-07 ENCOUNTER — Ambulatory Visit (INDEPENDENT_AMBULATORY_CARE_PROVIDER_SITE_OTHER): Payer: Medicaid Other | Admitting: Student

## 2019-01-07 VITALS — BP 107/67 | HR 82 | Temp 98.2°F | Wt 135.8 lb

## 2019-01-07 VITALS — BP 105/63 | HR 95 | Temp 98.0°F

## 2019-01-07 DIAGNOSIS — O0990 Supervision of high risk pregnancy, unspecified, unspecified trimester: Secondary | ICD-10-CM

## 2019-01-07 DIAGNOSIS — O365930 Maternal care for other known or suspected poor fetal growth, third trimester, not applicable or unspecified: Secondary | ICD-10-CM

## 2019-01-07 DIAGNOSIS — Z3A34 34 weeks gestation of pregnancy: Secondary | ICD-10-CM

## 2019-01-07 DIAGNOSIS — O26893 Other specified pregnancy related conditions, third trimester: Secondary | ICD-10-CM

## 2019-01-07 DIAGNOSIS — O98413 Viral hepatitis complicating pregnancy, third trimester: Secondary | ICD-10-CM | POA: Diagnosis not present

## 2019-01-07 DIAGNOSIS — B182 Chronic viral hepatitis C: Secondary | ICD-10-CM | POA: Diagnosis not present

## 2019-01-07 DIAGNOSIS — Z3480 Encounter for supervision of other normal pregnancy, unspecified trimester: Secondary | ICD-10-CM

## 2019-01-07 DIAGNOSIS — O0933 Supervision of pregnancy with insufficient antenatal care, third trimester: Secondary | ICD-10-CM

## 2019-01-07 DIAGNOSIS — B1920 Unspecified viral hepatitis C without hepatic coma: Secondary | ICD-10-CM

## 2019-01-07 NOTE — Progress Notes (Signed)
   PRENATAL VISIT NOTE  Subjective:  Amanda Buchanan is a 28 y.o. G3P1011 at 26w3dbeing seen today for ongoing prenatal care.  She is currently monitored for the following issues for this high-risk pregnancy and has Supervision of other normal pregnancy, antepartum; Pregnancy affected by fetal growth restriction; and Hepatitis C on their problem list.  Patient reports no complaints. Is getting weekly dopplers at MFM and growth scans. .   .  .  Movement: Present. Denies leaking of fluid.   The following portions of the patient's history were reviewed and updated as appropriate: allergies, current medications, past family history, past medical history, past social history, past surgical history and problem list.   Objective:   Vitals:   01/07/19 1114  BP: 107/67  Pulse: 82  Temp: 98.2 F (36.8 C)  Weight: 135 lb 12.8 oz (61.6 kg)    Fetal Status: Fetal Heart Rate (bpm): 144   Movement: Present     General:  Alert, oriented and cooperative. Patient is in no acute distress.  Skin: Skin is warm and dry. No rash noted.   Cardiovascular: Normal heart rate noted  Respiratory: Normal respiratory effort, no problems with respiration noted  Abdomen: Soft, gravid, appropriate for gestational age.        Pelvic: Cervical exam deferred        Extremities: Normal range of motion.  Edema: None  Mental Status: Normal mood and affect. Normal behavior. Normal judgment and thought content.   Assessment and Plan:  Pregnancy: G3P1011 at [redacted]w[redacted]d. Supervision of other normal pregnancy, antepartum -Patient with many questions about induction of labor; explained IOL at [id number] or sooner.  -Consulted with Dr. DaRosana Hoesegarding Hep C--recommends LFTs and Quant RNA today. No invasive procedures in labor, pending viral load.  -Discussed BC, patient unsure.  - Comp Met (CMET) - HCV RNA quant -Baby Scripts BPs reviewed; all normal.   Preterm labor symptoms and general obstetric precautions including but not  limited to vaginal bleeding, contractions, leaking of fluid and fetal movement were reviewed in detail with the patient. Please refer to After Visit Summary for other counseling recommendations.   Return in about 2 weeks (around 01/21/2019), or LROB in person .  Future Appointments  Date Time Provider [place]6/07/2019  9:45 AM WH-MFC [place] WH-MFCUS MFC-US  01/21/2019  9:15 AM [facility]orje Guild[facility]  [place]

## 2019-01-08 MED ORDER — PRENATAL PLUS 27-1 MG PO TABS: 1.0000 | ORAL_TABLET | Freq: Every day | ORAL | 5 refills | Status: DC

## 2019-01-09 ENCOUNTER — Other Ambulatory Visit (HOSPITAL_COMMUNITY): Payer: Medicaid Other

## 2019-01-10 LAB — COMPREHENSIVE METABOLIC PANEL
ALT: 7 IU/L (ref 0–32)
AST: 12 IU/L (ref 0–40)
Albumin/Globulin Ratio: 2 (ref 1.2–2.2)
Albumin: 3.9 g/dL (ref 3.9–5.0)
Alkaline Phosphatase: 74 IU/L (ref 39–117)
BUN/Creatinine Ratio: 17 (ref 9–23)
BUN: 7 mg/dL (ref 6–20)
Bilirubin Total: <0.2 mg/dL (ref 0.0–1.2)
CO2: 19 mmol/L — ABNORMAL LOW (ref 20–29)
Calcium: 8.8 mg/dL (ref 8.7–10.2)
Chloride: 102 mmol/L (ref 96–106)
Creatinine, Ser: 0.41 mg/dL — ABNORMAL LOW (ref 0.57–1.00)
GFR calc Af Amer: 163 mL/min/{1.73_m2} (ref >59)
GFR calc non Af Amer: 141 mL/min/{1.73_m2} (ref >59)
Globulin, Total: 2 g/dL (ref 1.5–4.5)
Glucose: 69 mg/dL (ref 65–99)
Potassium: 4 mmol/L (ref 3.5–5.2)
Sodium: 138 mmol/L (ref 134–144)
Total Protein: 5.9 g/dL — ABNORMAL LOW (ref 6.0–8.5)

## 2019-01-10 LAB — HCV RNA QUANT: Hepatitis C Quantitation: NOT DETECTED IU/mL

## 2019-01-17 ENCOUNTER — Other Ambulatory Visit: Payer: Self-pay

## 2019-01-17 ENCOUNTER — Ambulatory Visit (HOSPITAL_COMMUNITY)
Admission: RE | Admit: 2019-01-17 | Discharge: 2019-01-17 | Disposition: A | Discharge status: Home / Self Care | Payer: Medicaid Other | Source: Ambulatory Visit | Attending: Obstetrics and Gynecology | Admitting: Obstetrics and Gynecology

## 2019-01-17 ENCOUNTER — Telehealth: Payer: Self-pay | Admitting: Family Medicine

## 2019-01-17 ENCOUNTER — Other Ambulatory Visit (HOSPITAL_COMMUNITY): Payer: Self-pay | Admitting: Maternal & Fetal Medicine

## 2019-01-17 ENCOUNTER — Ambulatory Visit (HOSPITAL_COMMUNITY): Payer: Medicaid Other | Admitting: *Deleted

## 2019-01-17 DIAGNOSIS — Z3480 Encounter for supervision of other normal pregnancy, unspecified trimester: Secondary | ICD-10-CM | POA: Insufficient documentation

## 2019-01-17 DIAGNOSIS — Z3A35 35 weeks gestation of pregnancy: Secondary | ICD-10-CM

## 2019-01-17 DIAGNOSIS — O365930 Maternal care for other known or suspected poor fetal growth, third trimester, not applicable or unspecified: Secondary | ICD-10-CM

## 2019-01-17 DIAGNOSIS — O365990 Maternal care for other known or suspected poor fetal growth, unspecified trimester, not applicable or unspecified: Secondary | ICD-10-CM | POA: Diagnosis present

## 2019-01-17 DIAGNOSIS — O43193 Other malformation of placenta, third trimester: Secondary | ICD-10-CM

## 2019-01-17 DIAGNOSIS — O98413 Viral hepatitis complicating pregnancy, third trimester: Secondary | ICD-10-CM

## 2019-01-17 DIAGNOSIS — Z362 Encounter for other antenatal screening follow-up: Secondary | ICD-10-CM | POA: Diagnosis not present

## 2019-01-17 DIAGNOSIS — B182 Chronic viral hepatitis C: Secondary | ICD-10-CM

## 2019-01-17 DIAGNOSIS — O0933 Supervision of pregnancy with insufficient antenatal care, third trimester: Secondary | ICD-10-CM

## 2019-01-17 NOTE — Procedures (Signed)
Amanda Buchanan 12-30-1990 [redacted]w[redacted]d  Fetus A Non-Stress Test Interpretation for 01/17/19  Indication: IUGR  Fetal Heart Rate A Mode: External Baseline Rate (A): 135 bpm Variability: Moderate Accelerations: 15 x 15 Decelerations: None Multiple birth?: No  Uterine Activity Mode: Palpation, Toco Contraction Frequency (min): none noted Resting Tone Palpated: Relaxed Resting Time: Adequate  Interpretation (Fetal Testing) Nonstress Test Interpretation: Reactive Comments: Reviewed tracing with Dr. Donalee Citrin

## 2019-01-17 NOTE — Telephone Encounter (Signed)
Called and spoke to patient, she has been instructed to wear a face mask, she is also aware due to COVID no visitors are not allowed at this time.

## 2019-01-20 ENCOUNTER — Other Ambulatory Visit (HOSPITAL_COMMUNITY): Payer: Self-pay | Admitting: *Deleted

## 2019-01-20 DIAGNOSIS — O365930 Maternal care for other known or suspected poor fetal growth, third trimester, not applicable or unspecified: Secondary | ICD-10-CM

## 2019-01-21 ENCOUNTER — Ambulatory Visit (INDEPENDENT_AMBULATORY_CARE_PROVIDER_SITE_OTHER): Payer: Medicaid Other | Admitting: Nurse Practitioner

## 2019-01-21 ENCOUNTER — Other Ambulatory Visit: Payer: Self-pay

## 2019-01-21 ENCOUNTER — Telehealth (HOSPITAL_COMMUNITY): Payer: Self-pay | Admitting: *Deleted

## 2019-01-21 ENCOUNTER — Encounter (HOSPITAL_COMMUNITY): Payer: Self-pay | Admitting: *Deleted

## 2019-01-21 ENCOUNTER — Other Ambulatory Visit (HOSPITAL_COMMUNITY)
Admission: RE | Admit: 2019-01-21 | Discharge: 2019-01-21 | Disposition: A | Discharge status: Home / Self Care | Payer: Medicaid Other | Source: Ambulatory Visit | Attending: Student | Admitting: Student

## 2019-01-21 VITALS — BP 107/64 | HR 84 | Temp 98.4°F | Wt 136.1 lb

## 2019-01-21 DIAGNOSIS — Z3480 Encounter for supervision of other normal pregnancy, unspecified trimester: Secondary | ICD-10-CM | POA: Insufficient documentation

## 2019-01-21 DIAGNOSIS — Z3A36 36 weeks gestation of pregnancy: Secondary | ICD-10-CM

## 2019-01-21 DIAGNOSIS — Z3483 Encounter for supervision of other normal pregnancy, third trimester: Secondary | ICD-10-CM

## 2019-01-21 LAB — OB RESULTS CONSOLE GBS: GBS: POSITIVE

## 2019-01-21 NOTE — Progress Notes (Signed)
    Subjective:  Amanda Buchanan is a 28 y.o. G3P1011 at [redacted]w[redacted]d being seen today for ongoing prenatal care.  She is currently monitored for the following issues for this high-risk pregnancy and has Supervision of other normal pregnancy, antepartum; Pregnancy affected by fetal growth restriction; and Hepatitis C on their problem list.  Patient reports occasional contractions.  Contractions: Irritability. Vag. Bleeding: None.  Movement: Present. Denies leaking of fluid. Baby is moving well.  Ultrasound from Friday reviewed and consulted with Dr. Rosana Hoes.  The following portions of the patient's history were reviewed and updated as appropriate: allergies, current medications, past family history, past medical history, past social history, past surgical history and problem list. Problem list updated.  Objective:   Vitals:   01/21/19 0940  BP: 107/64  Pulse: 84  Temp: 98.4 F (36.9 C)  Weight: 136 lb 1.6 oz (61.7 kg)    Fetal Status: Fetal Heart Rate (bpm): 150 Fundal Height: 32 cm Movement: Present  Presentation: Vertex  General:  Alert, oriented and cooperative. Patient is in no acute distress.  Skin: Skin is warm and dry. No rash noted.   Cardiovascular: Normal heart rate noted  Respiratory: Normal respiratory effort, no problems with respiration noted  Abdomen: Soft, gravid, appropriate for gestational age. Pain/Pressure: Present     Pelvic:  Cervical exam performed Dilation: 1 Effacement (%): 50 Station: -3GC/Chlam and GBS done  Extremities: Normal range of motion.  Edema: Trace  Mental Status: Normal mood and affect. Normal behavior. Normal judgment and thought content.   Urinalysis:      Assessment and Plan:  Pregnancy: G3P1011 at [redacted]w[redacted]d  1. Supervision of other normal pregnancy, antepartum Scheduled for IOL for IUGR by recommendation of MFM - Dr. Rosana Hoes in agreement with this plan.  Will not do foley bulb due to IUGR and needing monitoring for labor contractions.  Orders placed. -  GC/Chlamydia probe amp ([place])not at [facility] - Culture, beta strep (group b only)  Preterm labor symptoms and general obstetric precautions including but not limited to vaginal bleeding, contractions, leaking of fluid and fetal movement were reviewed in detail with the patient. Please refer to After Visit Summary for other counseling recommendations.  Return in about 1 week (around 01/28/2019) for mychart visit and schedule for induction at 38 weeks on 02-01-19.  Earlie Server, RN, MSN, NP-BC Nurse Practitioner, [facility] for [employer], [facility] 01/21/2019 10:32 AM

## 2019-01-21 NOTE — Patient Instructions (Signed)

## 2019-01-21 NOTE — Telephone Encounter (Signed)
Preadmission screen  

## 2019-01-22 LAB — GC/CHLAMYDIA PROBE AMP (~~LOC~~) NOT AT ARMC
Chlamydia: NEGATIVE
Neisseria Gonorrhea: NEGATIVE

## 2019-01-24 ENCOUNTER — Other Ambulatory Visit: Payer: Self-pay

## 2019-01-24 ENCOUNTER — Encounter (HOSPITAL_COMMUNITY): Payer: Self-pay

## 2019-01-24 ENCOUNTER — Ambulatory Visit (HOSPITAL_BASED_OUTPATIENT_CLINIC_OR_DEPARTMENT_OTHER): Payer: Medicaid Other | Admitting: *Deleted

## 2019-01-24 ENCOUNTER — Ambulatory Visit (HOSPITAL_COMMUNITY): Payer: Medicaid Other | Admitting: *Deleted

## 2019-01-24 ENCOUNTER — Ambulatory Visit (HOSPITAL_COMMUNITY)
Admission: RE | Admit: 2019-01-24 | Discharge: 2019-01-24 | Disposition: A | Discharge status: Home / Self Care | Payer: Medicaid Other | Source: Ambulatory Visit | Attending: Obstetrics and Gynecology | Admitting: Obstetrics and Gynecology

## 2019-01-24 VITALS — BP 101/63 | HR 88 | Temp 98.6°F

## 2019-01-24 DIAGNOSIS — O0933 Supervision of pregnancy with insufficient antenatal care, third trimester: Secondary | ICD-10-CM

## 2019-01-24 DIAGNOSIS — B182 Chronic viral hepatitis C: Secondary | ICD-10-CM

## 2019-01-24 DIAGNOSIS — O98419 Viral hepatitis complicating pregnancy, unspecified trimester: Secondary | ICD-10-CM | POA: Diagnosis not present

## 2019-01-24 DIAGNOSIS — O43193 Other malformation of placenta, third trimester: Secondary | ICD-10-CM

## 2019-01-24 DIAGNOSIS — O98413 Viral hepatitis complicating pregnancy, third trimester: Secondary | ICD-10-CM | POA: Diagnosis not present

## 2019-01-24 DIAGNOSIS — O0990 Supervision of high risk pregnancy, unspecified, unspecified trimester: Secondary | ICD-10-CM | POA: Insufficient documentation

## 2019-01-24 DIAGNOSIS — O365930 Maternal care for other known or suspected poor fetal growth, third trimester, not applicable or unspecified: Secondary | ICD-10-CM | POA: Diagnosis not present

## 2019-01-24 DIAGNOSIS — Z3A36 36 weeks gestation of pregnancy: Secondary | ICD-10-CM

## 2019-01-24 LAB — CULTURE, BETA STREP (GROUP B ONLY): Strep Gp B Culture: POSITIVE — AB

## 2019-01-24 NOTE — Procedures (Signed)
Amanda Buchanan March 07, 1991 [redacted]w[redacted]d  Fetus A Non-Stress Test Interpretation for 01/24/19  Indication: IUGR  Fetal Heart Rate A Mode: External Baseline Rate (A): 130 bpm Variability: Moderate Accelerations: 15 x 15 Decelerations: None Multiple birth?: No  Uterine Activity Mode: Palpation, Toco Contraction Frequency (min): UI Resting Tone Palpated: Relaxed Resting Time: Adequate  Interpretation (Fetal Testing) Nonstress Test Interpretation: Reactive Comments: EFM tracing reviewed with Dr. Gertie Exon

## 2019-01-25 ENCOUNTER — Other Ambulatory Visit: Payer: Self-pay | Admitting: Advanced Practice Midwife

## 2019-01-26 ENCOUNTER — Encounter: Payer: Self-pay | Admitting: Nurse Practitioner

## 2019-01-26 DIAGNOSIS — O99820 Streptococcus B carrier state complicating pregnancy: Secondary | ICD-10-CM | POA: Insufficient documentation

## 2019-01-27 ENCOUNTER — Telehealth: Payer: Self-pay

## 2019-01-27 ENCOUNTER — Telehealth: Payer: Self-pay | Admitting: Advanced Practice Midwife

## 2019-01-27 NOTE — Telephone Encounter (Signed)
Called the patient to confirm the virtual appointment scheduled for tomorrow. The patient stated she is aware and knowledgeable of how to access the mychart visit.

## 2019-01-27 NOTE — Telephone Encounter (Addendum)
-----   Message from Virginia Rochester, NP sent at 01/26/2019 12:09 PM EDT ----- Please call client and review with her.  Will be given antibiotics in labor.  Notified pt results and tx.  Pt verbalized understanding with no further questions or concerns.

## 2019-01-28 ENCOUNTER — Telehealth (INDEPENDENT_AMBULATORY_CARE_PROVIDER_SITE_OTHER): Payer: Medicaid Other

## 2019-01-28 ENCOUNTER — Other Ambulatory Visit: Payer: Self-pay

## 2019-01-28 VITALS — BP 118/67 | HR 87

## 2019-01-28 DIAGNOSIS — B1920 Unspecified viral hepatitis C without hepatic coma: Secondary | ICD-10-CM

## 2019-01-28 DIAGNOSIS — Z3480 Encounter for supervision of other normal pregnancy, unspecified trimester: Secondary | ICD-10-CM

## 2019-01-28 DIAGNOSIS — Z3A37 37 weeks gestation of pregnancy: Secondary | ICD-10-CM

## 2019-01-28 DIAGNOSIS — O98413 Viral hepatitis complicating pregnancy, third trimester: Secondary | ICD-10-CM

## 2019-01-28 DIAGNOSIS — O365990 Maternal care for other known or suspected poor fetal growth, unspecified trimester, not applicable or unspecified: Secondary | ICD-10-CM

## 2019-01-28 DIAGNOSIS — O365930 Maternal care for other known or suspected poor fetal growth, third trimester, not applicable or unspecified: Secondary | ICD-10-CM

## 2019-01-28 NOTE — Progress Notes (Signed)
I connected with  Amanda Buchanan on 01/28/19 at 10:55 AM EDT by Video and verified that I am speaking with the correct person using two identifiers.   I discussed the limitations, risks, security and privacy concerns of performing an evaluation and management service by telephone and the availability of in person appointments. I also discussed with the patient that there may be a patient responsible charge related to this service. The patient expressed understanding and agreed to proceed.  [place], CMA 01/28/2019  10:52 AM

## 2019-01-28 NOTE — Progress Notes (Signed)
TELEHEALTH VIRTUAL OBSTETRICS VISIT ENCOUNTER NOTE  I connected with Amanda Buchanan on 01/28/19 at 10:55 AM EDT by MyChart Virtual Visit at home and verified that I am speaking with the correct person using two identifiers.   I discussed the limitations, risks, security and privacy concerns of performing an evaluation and management service by telephone and the availability of in person appointments. I also discussed with the patient that there may be a patient responsible charge related to this service. The patient expressed understanding and agreed to proceed.  Subjective:  Amanda Buchanan is a 28 y.o. G3P1011 at [redacted]w[redacted]d being followed for ongoing prenatal care.  She is currently monitored for the following issues for this low-risk pregnancy and has Supervision of other normal pregnancy, antepartum; Pregnancy affected by fetal growth restriction; Hepatitis C; and Group B Streptococcus carrier state affecting pregnancy on their problem list.  Patient reports no complaints. Reports fetal movement. Denies any contractions, bleeding or leaking of fluid.   The following portions of the patient's history were reviewed and updated as appropriate: allergies, current medications, past family history, past medical history, past social history, past surgical history and problem list.   Objective:   General:  Alert, oriented and cooperative.   Mental Status: Normal mood and affect perceived. Normal judgment and thought content.  Rest of physical exam deferred due to type of encounter  Assessment and Plan:  Pregnancy: G3P1011 at [redacted]w[redacted]d 1. Supervision of other normal pregnancy, antepartum -BP 118/67 -Counseled patient on routine COVID-19 testing for all admission to inpatient units whether direct admit or from MAU/ED. Reviewed reasons for testing including identifying cases and protecting patients/staff from possible infection. Reassured patient that separation from newborn is not required for COVID+ tests  in asymptomatic patients and that the plan of care will be created with Peds through shared decision-making. One support person is allowed for all admitted patients regardless of COVID test results. All patient questions answered.   -Patient scheduled for COVID testing tomorrow morning. Discussed staying home after testing is completed -IOL scheduled 6/27 per MFM. Discussed IOL methods at length  2. Pregnancy affected by fetal growth restriction -Patient scheduled at MFM for dopplers/growth on 6/26  3. Hepatitis C virus infection without hepatic coma, unspecified chronicity -Viral load undetectable 6/2 -Patient upset Hep C in chart. Discussed with patient that this part of her medical hx and listed as a previous diagnosis even though undetectable at this time.   Term labor symptoms and general obstetric precautions including but not limited to vaginal bleeding, contractions, leaking of fluid and fetal movement were reviewed in detail with the patient.  I discussed the assessment and treatment plan with the patient. The patient was provided an opportunity to ask questions and all were answered. The patient agreed with the plan and demonstrated an understanding of the instructions. The patient was advised to call back or seek an in-person office evaluation/go to MAU at [facility] for any urgent or concerning symptoms. Please refer to After Visit Summary for other counseling recommendations.   I provided 15 minutes of non-face-to-face time during this encounter.  Return in about 1 week (around 02/04/2019) for Return OB visit.  Future Appointments  Date Time Provider [place]  01/29/2019 10:00 AM MC-MAU 1 MC-INDC None  01/31/2019  8:30 AM WH-MFC NURSE WH-MFC MFC-US  01/31/2019  8:30 AM WH-MFC [place] 5 WH-MFCUS MFC-US  01/31/2019  9:45 AM WH-MFC NST WH-MFC MFC-US  02/01/2019  7:30 AM MC-LD SCHED ROOM MC-INDC None  Rolm Bookbinderaroline M , CNM [facility]

## 2019-01-29 ENCOUNTER — Other Ambulatory Visit (HOSPITAL_COMMUNITY)
Admission: RE | Admit: 2019-01-29 | Discharge: 2019-01-29 | Disposition: A | Discharge status: Home / Self Care | Payer: Medicaid Other | Source: Ambulatory Visit | Attending: Obstetrics and Gynecology | Admitting: Obstetrics and Gynecology

## 2019-01-29 ENCOUNTER — Other Ambulatory Visit: Payer: Self-pay

## 2019-01-29 DIAGNOSIS — Z1159 Encounter for screening for other viral diseases: Secondary | ICD-10-CM | POA: Diagnosis present

## 2019-01-29 LAB — SARS CORONAVIRUS 2 (TAT 6-24 HRS): SARS Coronavirus 2: NEGATIVE

## 2019-01-29 NOTE — MAU Note (Signed)
Covid swab collected. Pt tolerated well. Pt asymptomatic 

## 2019-01-31 ENCOUNTER — Encounter (HOSPITAL_COMMUNITY): Payer: Self-pay | Admitting: *Deleted

## 2019-01-31 ENCOUNTER — Ambulatory Visit (HOSPITAL_BASED_OUTPATIENT_CLINIC_OR_DEPARTMENT_OTHER): Payer: Medicaid Other | Admitting: *Deleted

## 2019-01-31 ENCOUNTER — Ambulatory Visit (HOSPITAL_COMMUNITY): Payer: Medicaid Other | Admitting: *Deleted

## 2019-01-31 ENCOUNTER — Ambulatory Visit (HOSPITAL_BASED_OUTPATIENT_CLINIC_OR_DEPARTMENT_OTHER)
Admission: RE | Admit: 2019-01-31 | Discharge: 2019-01-31 | Disposition: A | Discharge status: Home / Self Care | Payer: Medicaid Other | Source: Ambulatory Visit | Attending: Obstetrics and Gynecology | Admitting: Obstetrics and Gynecology

## 2019-01-31 ENCOUNTER — Other Ambulatory Visit: Payer: Self-pay

## 2019-01-31 DIAGNOSIS — O0933 Supervision of pregnancy with insufficient antenatal care, third trimester: Secondary | ICD-10-CM | POA: Diagnosis not present

## 2019-01-31 DIAGNOSIS — O365930 Maternal care for other known or suspected poor fetal growth, third trimester, not applicable or unspecified: Secondary | ICD-10-CM

## 2019-01-31 DIAGNOSIS — O98413 Viral hepatitis complicating pregnancy, third trimester: Secondary | ICD-10-CM

## 2019-01-31 DIAGNOSIS — B182 Chronic viral hepatitis C: Secondary | ICD-10-CM

## 2019-01-31 DIAGNOSIS — O43193 Other malformation of placenta, third trimester: Secondary | ICD-10-CM

## 2019-01-31 DIAGNOSIS — O365990 Maternal care for other known or suspected poor fetal growth, unspecified trimester, not applicable or unspecified: Secondary | ICD-10-CM

## 2019-01-31 DIAGNOSIS — Z3A37 37 weeks gestation of pregnancy: Secondary | ICD-10-CM

## 2019-01-31 DIAGNOSIS — Z3480 Encounter for supervision of other normal pregnancy, unspecified trimester: Secondary | ICD-10-CM

## 2019-01-31 NOTE — Procedures (Signed)
Amanda Buchanan 09-01-1990 [redacted]w[redacted]d  Fetus A Non-Stress Test Interpretation for 01/31/19  Indication: IUGR  Fetal Heart Rate A Mode: External Baseline Rate (A): 140 bpm Variability: Moderate Accelerations: 15 x 15 Decelerations: None Multiple birth?: No  Uterine Activity Mode: Palpation, Toco Contraction Frequency (min): Occas. Contraction Duration (sec): 40-45 Contraction Quality: Mild Resting Time: Adequate  Interpretation (Fetal Testing) Nonstress Test Interpretation: Reactive Comments: EFM tracing reviewed by Dr. Gertie Exon

## 2019-01-31 NOTE — Progress Notes (Signed)
  States she feels some leaking of fluid and wetness occas throughout the day. Not continuous. Reported to U/S tech.

## 2019-02-01 ENCOUNTER — Inpatient Hospital Stay (HOSPITAL_COMMUNITY): Payer: Medicaid Other | Admitting: Anesthesiology

## 2019-02-01 ENCOUNTER — Inpatient Hospital Stay (HOSPITAL_COMMUNITY)
Admission: AD | Admit: 2019-02-01 | Discharge: 2019-02-03 | DRG: 806 | Disposition: A | Discharge status: Home / Self Care | Payer: Medicaid Other | Attending: Family Medicine | Admitting: Family Medicine

## 2019-02-01 ENCOUNTER — Inpatient Hospital Stay (HOSPITAL_COMMUNITY): Payer: Medicaid Other

## 2019-02-01 ENCOUNTER — Encounter (HOSPITAL_COMMUNITY): Payer: Self-pay | Admitting: *Deleted

## 2019-02-01 ENCOUNTER — Other Ambulatory Visit: Payer: Self-pay

## 2019-02-01 DIAGNOSIS — O9842 Viral hepatitis complicating childbirth: Secondary | ICD-10-CM | POA: Diagnosis present

## 2019-02-01 DIAGNOSIS — O365930 Maternal care for other known or suspected poor fetal growth, third trimester, not applicable or unspecified: Secondary | ICD-10-CM | POA: Diagnosis present

## 2019-02-01 DIAGNOSIS — O99334 Smoking (tobacco) complicating childbirth: Secondary | ICD-10-CM | POA: Diagnosis present

## 2019-02-01 DIAGNOSIS — A6000 Herpesviral infection of urogenital system, unspecified: Secondary | ICD-10-CM | POA: Diagnosis present

## 2019-02-01 DIAGNOSIS — F17210 Nicotine dependence, cigarettes, uncomplicated: Secondary | ICD-10-CM | POA: Diagnosis present

## 2019-02-01 DIAGNOSIS — O99820 Streptococcus B carrier state complicating pregnancy: Secondary | ICD-10-CM

## 2019-02-01 DIAGNOSIS — O9832 Other infections with a predominantly sexual mode of transmission complicating childbirth: Secondary | ICD-10-CM | POA: Diagnosis present

## 2019-02-01 DIAGNOSIS — Z3A38 38 weeks gestation of pregnancy: Secondary | ICD-10-CM | POA: Diagnosis not present

## 2019-02-01 DIAGNOSIS — O365990 Maternal care for other known or suspected poor fetal growth, unspecified trimester, not applicable or unspecified: Secondary | ICD-10-CM

## 2019-02-01 DIAGNOSIS — O99824 Streptococcus B carrier state complicating childbirth: Secondary | ICD-10-CM | POA: Diagnosis present

## 2019-02-01 DIAGNOSIS — B1920 Unspecified viral hepatitis C without hepatic coma: Secondary | ICD-10-CM | POA: Diagnosis present

## 2019-02-01 DIAGNOSIS — Z3480 Encounter for supervision of other normal pregnancy, unspecified trimester: Secondary | ICD-10-CM

## 2019-02-01 DIAGNOSIS — O43123 Velamentous insertion of umbilical cord, third trimester: Secondary | ICD-10-CM | POA: Diagnosis present

## 2019-02-01 DIAGNOSIS — O365931 Maternal care for other known or suspected poor fetal growth, third trimester, fetus 1: Secondary | ICD-10-CM | POA: Diagnosis present

## 2019-02-01 LAB — CBC
HCT: 31.5 % — ABNORMAL LOW (ref 36.0–46.0)
Hemoglobin: 10.8 g/dL — ABNORMAL LOW (ref 12.0–15.0)
MCH: 33.5 pg (ref 26.0–34.0)
MCHC: 34.3 g/dL (ref 30.0–36.0)
MCV: 97.8 fL (ref 80.0–100.0)
Platelets: 164 10*3/uL (ref 150–400)
RBC: 3.22 MIL/uL — ABNORMAL LOW (ref 3.87–5.11)
RDW: 14.7 % (ref 11.5–15.5)
WBC: 12.3 10*3/uL — ABNORMAL HIGH (ref 4.0–10.5)
nRBC: 0 % (ref 0.0–0.2)

## 2019-02-01 LAB — RAPID URINE DRUG SCREEN, HOSP PERFORMED
Amphetamines: NOT DETECTED
Barbiturates: NOT DETECTED
Benzodiazepines: NOT DETECTED
Cocaine: NOT DETECTED
Opiates: NOT DETECTED
Tetrahydrocannabinol: NOT DETECTED

## 2019-02-01 LAB — PROTIME-INR
INR: 1 (ref 0.8–1.2)
Prothrombin Time: 12.8 seconds (ref 11.4–15.2)

## 2019-02-01 LAB — TYPE AND SCREEN
ABO/RH(D): A POS
Antibody Screen: NEGATIVE

## 2019-02-01 LAB — ABO/RH: ABO/RH(D): A POS

## 2019-02-01 MED ORDER — SODIUM CHLORIDE 0.9 % IV SOLN
5.0000 10*6.[IU] | Freq: Once | INTRAVENOUS | Status: AC
  Administered 2019-02-01: 09:00:00 5 10*6.[IU] via INTRAVENOUS
  Filled 2019-02-01: qty 5

## 2019-02-01 MED ORDER — OXYCODONE-ACETAMINOPHEN 5-325 MG PO TABS: 1.0000 | ORAL_TABLET | ORAL | Status: DC | PRN

## 2019-02-01 MED ORDER — EPHEDRINE 5 MG/ML INJ: 10.0000 mg | INTRAVENOUS | Status: DC | PRN

## 2019-02-01 MED ORDER — DIPHENHYDRAMINE HCL 50 MG/ML IJ SOLN
12.5000 mg | INTRAMUSCULAR | Status: DC | PRN
  Administered 2019-02-01: 22:00:00 12.5 mg via INTRAVENOUS
  Filled 2019-02-01: qty 1

## 2019-02-01 MED ORDER — PHENYLEPHRINE 40 MCG/ML (10ML) SYRINGE FOR IV PUSH (FOR BLOOD PRESSURE SUPPORT): 80.0000 ug | PREFILLED_SYRINGE | INTRAVENOUS | Status: DC | PRN

## 2019-02-01 MED ORDER — LACTATED RINGERS IV SOLN: 500.0000 mL | INTRAVENOUS | Status: DC | PRN

## 2019-02-01 MED ORDER — LIDOCAINE HCL (PF) 1 % IJ SOLN: 30.0000 mL | INTRAMUSCULAR | Status: DC | PRN

## 2019-02-01 MED ORDER — LIDOCAINE HCL (PF) 1 % IJ SOLN
INTRAMUSCULAR | Status: DC | PRN
  Administered 2019-02-01: 10 mL via EPIDURAL

## 2019-02-01 MED ORDER — OXYTOCIN 40 UNITS IN NORMAL SALINE INFUSION - SIMPLE MED
2.5000 [IU]/h | INTRAVENOUS | Status: DC
  Filled 2019-02-01: qty 1000

## 2019-02-01 MED ORDER — MISOPROSTOL 25 MCG QUARTER TABLET
25.0000 ug | ORAL_TABLET | ORAL | Status: DC | PRN
  Administered 2019-02-01 (×2): 25 ug via VAGINAL
  Filled 2019-02-01 (×2): qty 1

## 2019-02-01 MED ORDER — TERBUTALINE SULFATE 1 MG/ML IJ SOLN: 0.2500 mg | Freq: Once | INTRAMUSCULAR | Status: DC | PRN

## 2019-02-01 MED ORDER — SOD CITRATE-CITRIC ACID 500-334 MG/5ML PO SOLN: 30.0000 mL | ORAL | Status: DC | PRN

## 2019-02-01 MED ORDER — OXYTOCIN BOLUS FROM INFUSION
500.0000 mL | Freq: Once | INTRAVENOUS | Status: AC
  Administered 2019-02-02: 500 mL via INTRAVENOUS

## 2019-02-01 MED ORDER — ONDANSETRON HCL 4 MG/2ML IJ SOLN: 4.0000 mg | Freq: Four times a day (QID) | INTRAMUSCULAR | Status: DC | PRN

## 2019-02-01 MED ORDER — FENTANYL-BUPIVACAINE-NACL 0.5-0.125-0.9 MG/250ML-% EP SOLN
12.0000 mL/h | EPIDURAL | Status: DC | PRN
  Filled 2019-02-01: qty 250

## 2019-02-01 MED ORDER — SODIUM CHLORIDE (PF) 0.9 % IJ SOLN
INTRAMUSCULAR | Status: DC | PRN
  Administered 2019-02-01: 12 mL/h via EPIDURAL

## 2019-02-01 MED ORDER — FLEET ENEMA 7-19 GM/118ML RE ENEM: 1.0000 | ENEMA | RECTAL | Status: DC | PRN

## 2019-02-01 MED ORDER — OXYCODONE-ACETAMINOPHEN 5-325 MG PO TABS: 2.0000 | ORAL_TABLET | ORAL | Status: DC | PRN

## 2019-02-01 MED ORDER — LACTATED RINGERS IV SOLN
INTRAVENOUS | Status: DC
  Administered 2019-02-01 (×4): via INTRAVENOUS

## 2019-02-01 MED ORDER — FENTANYL CITRATE (PF) 100 MCG/2ML IJ SOLN
INTRAMUSCULAR | Status: AC
  Filled 2019-02-01: qty 2

## 2019-02-01 MED ORDER — FENTANYL CITRATE (PF) 100 MCG/2ML IJ SOLN
100.0000 ug | Freq: Once | INTRAMUSCULAR | Status: AC
  Administered 2019-02-01: 100 ug via INTRAVENOUS

## 2019-02-01 MED ORDER — LACTATED RINGERS IV SOLN
500.0000 mL | Freq: Once | INTRAVENOUS | Status: AC
  Administered 2019-02-01: 500 mL via INTRAVENOUS

## 2019-02-01 MED ORDER — ACETAMINOPHEN 325 MG PO TABS: 650.0000 mg | ORAL_TABLET | ORAL | Status: DC | PRN

## 2019-02-01 MED ORDER — PENICILLIN G 3 MILLION UNITS IVPB - SIMPLE MED
3.0000 10*6.[IU] | INTRAVENOUS | Status: DC
  Administered 2019-02-01 (×3): 3 10*6.[IU] via INTRAVENOUS
  Filled 2019-02-01 (×3): qty 100

## 2019-02-01 NOTE — H&P (Signed)
OBSTETRIC ADMISSION HISTORY AND PHYSICAL  Amanda Buchanan is a 28 y.o. female G3P1011 with IUP at 2836w0d by LMP presenting for IOL for FGR. She reports +FMs, No LOF, no VB, no blurry vision, headaches or peripheral edema, and RUQ pain.  She plans on breast feeding. She is unsure what she desires for birth control. [facility]   Dating: By LMP --->  Estimated Date of Delivery: 02/15/19  Sono:    @[redacted]w[redacted]d , CWD, normal anatomy, cephalic presentation, 2132g, <29%<10% EFW   Nursing Staff Provider  Office Location   [facility] Dating   LMP  Language  English Anatomy US  Growth restriction, followups ordered  Flu Vaccine   Genetic Screen      TDaP vaccine   01/07/2019 Hgb A1C or  GTT Early  Third trimester: normal  Rhogam  n/a   LAB RESULTS   Feeding Plan breast Blood Type A/Positive/-- (12/30 0000)   Contraception  unsure Antibody Negative (12/30 0000)  Circumcision N/a  Rubella Immune, Nonimmune (12/30 0000)  Pediatrician   RPR Nonreactive, Nonreactive (12/30 0000)   Support Person Amos (FOB) HBsAg Negative, Negative (12/30 0000)   Prenatal Classes  HIV Non-reactive, Non-reactive (12/30 0000)  BTL Consent  GBS  (For PCN allergy, check sensitivities)   VBAC Consent  Pap     Hgb Electro      CF     SMA     Waterbirth  [ ]  Class [ ]  Consent [ ]  CNM visit    Prenatal History/Complications:  Past Medical History: Past Medical History:  Diagnosis Date  . ADHD   . History of drug abuse (HCC)   . PTSD (post-traumatic stress disorder)     Past Surgical History: Past Surgical History:  Procedure Laterality Date  . HAND SURGERY  2016  . WISDOM TOOTH EXTRACTION  2018    Obstetrical History: OB History    Gravida  3   Para  1   Term  1   Preterm      AB  1   Living  1     SAB  1   TAB      Ectopic      Multiple      Live Births  1           Social History: Social History   Socioeconomic History  . Marital status: Married    Spouse name:  Not on file  . Number of children: Not on file  . Years of education: Not on file  . Highest education level: Not on file  Occupational History  . Not on file  Social Needs  . Financial resource strain: Not hard at all  . Food insecurity    Worry: Never true    Inability: Never true  . Transportation needs    Medical: No    Non-medical: Not on file  Tobacco Use  . Smoking status: Current Some Day Smoker    Types: Cigarettes  . Smokeless tobacco: Never Used  Substance and Sexual Activity  . Alcohol use: Not Currently  . Drug use: Not Currently    Types: Heroin, Cocaine, Methamphetamines    Comment: last used 2016  . Sexual activity: Yes    Birth control/protection: None  Lifestyle  . Physical activity    Days per week: Not on file    Minutes per session: Not on file  . Stress: To some extent  Relationships  . Social connections  Talks on phone: Not on file    Gets together: Not on file    Attends religious service: Not on file    Active member of club or organization: Not on file    Attends meetings of clubs or organizations: Not on file    Relationship status: Not on file  Other Topics Concern  . Not on file  Social History Narrative  . Not on file    Family History: Family History  Problem Relation Age of Onset  . Pancreatic cancer Mother   . Cervical cancer Mother     Allergies: No Known Allergies  Medications Prior to Admission  Medication Sig Dispense Refill Last Dose  . ferrous sulfate 325 (65 FE) MG tablet Take 1 tablet (325 mg total) by mouth daily with breakfast. 30 tablet 3   . meloxicam (MOBIC) 15 MG tablet Mobic 15 mg tablet  Take 1 tablet every day by oral route.     Marland Kitchen. omeprazole (PRILOSEC) 20 MG capsule Take 20 mg by mouth daily.     . Prenatal Vit-Fe Fumarate-FA (PRENATAL VITAMINS PO) Take by mouth.     . prenatal vitamin w/FE, FA (PRENATAL 1 + 1) 27-1 MG TABS tablet Take 1 tablet by mouth daily at 12 noon. 30 each 5   . valACYclovir  (VALTREX) 500 MG tablet Take 1 tablet (500 mg total) by mouth 2 (two) times daily. 60 tablet 6      Review of Systems   All systems reviewed and negative except as stated in HPI  Blood pressure (!) 98/59, pulse 98, temperature 98 F (36.7 C), temperature source Oral, resp. rate 20, height 5\' 3"  (1.6 m), weight 64.4 kg, last menstrual period 05/11/2018. General appearance: alert, cooperative and no distress Lungs: clear to auscultation bilaterally Heart: regular rate and rhythm Abdomen: soft, non-tender; bowel sounds normal Pelvic: no evidence of HSV lesions Extremities: Homans sign is negative, no sign of DVT DTR's +2 Presentation: cephalic Fetal monitoringBaseline: 125 bpm, Variability: Good {> 6 bpm), Accelerations: Reactive and Decelerations: Absent Uterine activity: occasional uc's Dilation: 1 Effacement (%): 50 Station: -3 Exam by:: Druscilla BrownieNeill, CNM   Prenatal labs: ABO, Rh: A/Positive/-- (12/30 0000) Antibody: Negative (12/30 0000) Rubella: Immune, Nonimmune (12/30 0000) RPR: Non Reactive (04/21 1041)  HBsAg: Negative, Negative (12/30 0000)  HIV: Non Reactive (04/21 1041)  GBS: Positive (06/16 0000)   Prenatal Transfer Tool  Maternal Diabetes: No Genetic Screening: Declined Maternal Ultrasounds/Referrals: IUGR Fetal Ultrasounds or other Referrals:  None Maternal Substance Abuse:  No : denies current use. Hx of IV drug use Significant Maternal Medications:  None Significant Maternal Lab Results: Group B Strep positive  Results for orders placed or performed during the hospital encounter of 02/01/19 (from the past 24 hour(s))  CBC   Collection Time: 02/01/19  8:13 AM  Result Value Ref Range   WBC 12.3 (H) 4.0 - 10.5 K/uL   RBC 3.22 (L) 3.87 - 5.11 MIL/uL   Hemoglobin 10.8 (L) 12.0 - 15.0 g/dL   HCT 63.831.5 (L) 75.636.0 - 43.346.0 %   MCV 97.8 80.0 - 100.0 fL   MCH 33.5 26.0 - 34.0 pg   MCHC 34.3 30.0 - 36.0 g/dL   RDW 29.514.7 18.811.5 - 41.615.5 %   Platelets 164 150 - 400 K/uL    nRBC 0.0 0.0 - 0.2 %    Patient Active Problem List   Diagnosis Date Noted  . IUGR (intrauterine growth restriction) affecting care of mother, third trimester, fetus 1 02/01/2019  .  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 Hepatitis C 12/31/2018  . Pregnancy affected by fetal growth restriction 12/26/2018  . Supervision of other normal pregnancy, antepartum 11/25/2018    Assessment/Plan:  Jaquila Santelli is a 28 y.o. G3P1011 at [redacted]w[redacted]d here for IOL for FGR. HSV on prophylaxis, hx Hep C with undetectable viral load  #Labor: cytotec and foley when able #Pain: Per patient request, planning epidural #FWB: Cat 1 #ID:  GBS pos #MOF: Breast #MOC: Unsure #Circ:  [place], CNM  02/01/2019, 9:05 AM

## 2019-02-01 NOTE — Anesthesia Preprocedure Evaluation (Signed)
Anesthesia Evaluation  Patient identified by MRN, date of birth, ID band Patient awake    Reviewed: Allergy & Precautions, H&P , NPO status , Patient's Chart, lab work & pertinent test results  History of Anesthesia Complications Negative for: history of anesthetic complications  Airway Mallampati: II  TM Distance: >3 FB Neck ROM: full    Dental no notable dental hx.    Pulmonary neg pulmonary ROS, Current Smoker,    Pulmonary exam normal        Cardiovascular negative cardio ROS Normal cardiovascular exam Rhythm:regular Rate:Normal     Neuro/Psych PSYCHIATRIC DISORDERS (PTSD) negative neurological ROS     GI/Hepatic negative GI ROS, (+)     substance abuse  , Hepatitis -, C  Endo/Other  negative endocrine ROS  Renal/GU negative Renal ROS  negative genitourinary   Musculoskeletal   Abdominal   Peds  Hematology negative hematology ROS (+)   Anesthesia Other Findings   Reproductive/Obstetrics (+) Pregnancy                             Anesthesia Physical Anesthesia Plan  ASA: III  Anesthesia Plan: Epidural   Post-op Pain Management:    Induction:   PONV Risk Score and Plan:   Airway Management Planned:   Additional Equipment:   Intra-op Plan:   Post-operative Plan:   Informed Consent: I have reviewed the patients History and Physical, chart, labs and discussed the procedure including the risks, benefits and alternatives for the proposed anesthesia with the patient or authorized representative who has indicated his/her understanding and acceptance.       Plan Discussed with:   Anesthesia Plan Comments:         Anesthesia Quick Evaluation

## 2019-02-01 NOTE — Anesthesia Procedure Notes (Signed)
Epidural Patient location during procedure: OB Start time: 02/01/2019 5:36 PM End time: 02/01/2019 5:46 PM  Staffing Anesthesiologist: Lidia Collum, MD Performed: anesthesiologist   Preanesthetic Checklist Completed: patient identified, pre-op evaluation, timeout performed, IV checked, risks and benefits discussed and monitors and equipment checked  Epidural Patient position: sitting Prep: DuraPrep Patient monitoring: heart rate, continuous pulse ox and blood pressure Approach: midline Location: L3-L4 Injection technique: LOR air  Needle:  Needle type: Tuohy  Needle gauge: 17 G Needle length: 9 cm Needle insertion depth: 4 cm Catheter type: closed end flexible Catheter size: 19 Gauge Catheter at skin depth: 9 cm Test dose: negative  Assessment Events: blood not aspirated, injection not painful, no injection resistance, negative IV test and no paresthesia  Additional Notes Reason for block:procedure for pain

## 2019-02-01 NOTE — Progress Notes (Signed)
Labor Progress Note Amanda Buchanan is a 28 y.o. G3P1011 at [redacted]w[redacted]d presented for IOL for FGR  S:  Patient very uncomfortable with contractions. Requesting epidural  O:  BP 98/60   Pulse 74   Temp 98.1 F (36.7 C) (Oral)   Resp 20   Ht 5\' 3"  (1.6 m)   Wt 64.4 kg   LMP 05/11/2018 (Exact Date)   BMI 25.15 kg/m   Fetal Tracing:  Baseline: 140 Variability: moderate Accels: 15x15 Decels: none  Toco: 2-3   CVE: Dilation: 4 Effacement (%): 70 Station: -2 Presentation: Vertex Exam by:: J.Cox, RN   A&P: 28 y.o. G3P1011 [redacted]w[redacted]d IOL for FGR #Labor: Progressing well. Anesthesia requesting INR before epidural placement. Order placed stat.  #Pain: epidural  #FWB: Cat 1 #GBS positive  Amanda Buchanan, CNM 4:46 PM

## 2019-02-01 NOTE — Progress Notes (Signed)
OB/GYN Faculty Practice: Labor Progress Note  Subjective:  Patient has some itching and shivering from epidural.  But otherwise has no complaints.  Objective:  BP (!) 99/59   Pulse 78   Temp 98.1 F (36.7 C) (Oral)   Resp 16   Ht 5\' 3"  (1.6 m)   Wt 64.4 kg   LMP 05/11/2018 (Exact Date)   SpO2 100%   BMI 25.15 kg/m  Gen: Lying in bed comfortably. NAD.  Extremities: No signs of DVT.   CE: Dilation: 5.5 Effacement (%): 60 Station: -1 Presentation: Vertex Exam by:: Amanda Mercury, MD Contractions: q2 minutes, regular, > 243mVU FH: BL 130, mod var, + a, -d.   Assessment and Plan:  Amanda Buchanan is a 28 y.o. G3P1011 at [redacted]w[redacted]d - IOL IUGR  Labor: s/p cytox2 and AROM. Placed IUPC and contracting adequately and regularly. Continue without further intervention at this time.  . Pain control: Epidural . Anticipated MOD: NSVD . PPH Risk: min   Fetal Wellbeing: Cat I tracing . GBS Positive (06/16 0000) >> PCN . Continuous fetal monitoring  Amanda Buchanan, M.D.  Family Medicine  PGY-1 02/01/2019 10:47 PM

## 2019-02-01 NOTE — Progress Notes (Signed)
Labor Progress Note Yanisa Goodgame is a 28 y.o. G3P1011 at [redacted]w[redacted]d presented for IOL for FGR  S:  Patient comfortable with epidural  O:  BP (!) 97/50   Pulse 65   Temp 98.2 F (36.8 C) (Oral)   Resp 20   Ht 5\' 3"  (1.6 m)   Wt 64.4 kg   LMP 05/11/2018 (Exact Date)   SpO2 100%   BMI 25.15 kg/m   Fetal Tracing:  Baseline: 120 Variability: moderate Accels: 15x15 Decels: none  Toco: 2-3   CVE: Dilation: 6 Effacement (%): 70 Station: -2 Presentation: Vertex Exam by:: Maryruth Hancock, CNM   A&P: 28 y.o. G3P1011 [redacted]w[redacted]d IOL for FGR #Labor: Progressing well. Discussed with patient AROM for augmentation. Patient agreeable to plan of care. AROM with moderate amount of clear fluid. Patient tolerated well. Patient contracting frequently and making cervical change. Expectant management at this time.  #Pain: epidural #FWB: Cat 1 #GBS positive  Wende Mott, CNM

## 2019-02-01 NOTE — Progress Notes (Signed)
Labor Progress Note Amanda Buchanan is a 28 y.o. G3P1011 at [redacted]w[redacted]d presented for IOL for FGR  S:  Patient reporting cramping.  O:  BP 98/60   Pulse 74   Temp 98.1 F (36.7 C) (Oral)   Resp 20   Ht 5\' 3"  (1.6 m)   Wt 64.4 kg   LMP 05/11/2018 (Exact Date)   BMI 25.15 kg/m   Fetal Tracing:  Baseline: 135 Variability: moderate Accels: 15x15 Decels: none  Toco: 6-10   CVE: 2/50/-2   A&P: 28 y.o. G3P1011 [redacted]w[redacted]d IOL fort FGR #Labor: Progressing well. Continue cytotec for now. Patient declines FB #Pain: n/a #FWB: Cat 1 #GBS positive  Wende Mott, CNM

## 2019-02-02 ENCOUNTER — Encounter (HOSPITAL_COMMUNITY): Payer: Self-pay

## 2019-02-02 DIAGNOSIS — O99824 Streptococcus B carrier state complicating childbirth: Secondary | ICD-10-CM

## 2019-02-02 DIAGNOSIS — O9842 Viral hepatitis complicating childbirth: Secondary | ICD-10-CM

## 2019-02-02 DIAGNOSIS — Z3A38 38 weeks gestation of pregnancy: Secondary | ICD-10-CM

## 2019-02-02 DIAGNOSIS — B1920 Unspecified viral hepatitis C without hepatic coma: Secondary | ICD-10-CM

## 2019-02-02 DIAGNOSIS — O365930 Maternal care for other known or suspected poor fetal growth, third trimester, not applicable or unspecified: Secondary | ICD-10-CM

## 2019-02-02 LAB — CBC
HCT: 28.7 % — ABNORMAL LOW (ref 36.0–46.0)
Hemoglobin: 9.8 g/dL — ABNORMAL LOW (ref 12.0–15.0)
MCH: 33.2 pg (ref 26.0–34.0)
MCHC: 34.1 g/dL (ref 30.0–36.0)
MCV: 97.3 fL (ref 80.0–100.0)
Platelets: 147 10*3/uL — ABNORMAL LOW (ref 150–400)
RBC: 2.95 MIL/uL — ABNORMAL LOW (ref 3.87–5.11)
RDW: 14.5 % (ref 11.5–15.5)
WBC: 15 10*3/uL — ABNORMAL HIGH (ref 4.0–10.5)
nRBC: 0 % (ref 0.0–0.2)

## 2019-02-02 MED ORDER — DIPHENHYDRAMINE HCL 25 MG PO CAPS: 25.0000 mg | ORAL_CAPSULE | Freq: Four times a day (QID) | ORAL | Status: DC | PRN

## 2019-02-02 MED ORDER — ACETAMINOPHEN 325 MG PO TABS: 650.0000 mg | ORAL_TABLET | ORAL | Status: DC | PRN

## 2019-02-02 MED ORDER — COCONUT OIL OIL
1.0000 "application " | TOPICAL_OIL | Status: DC | PRN
  Administered 2019-02-02: 1 via TOPICAL

## 2019-02-02 MED ORDER — ONDANSETRON HCL 4 MG PO TABS: 4.0000 mg | ORAL_TABLET | ORAL | Status: DC | PRN

## 2019-02-02 MED ORDER — SIMETHICONE 80 MG PO CHEW: 80.0000 mg | CHEWABLE_TABLET | ORAL | Status: DC | PRN

## 2019-02-02 MED ORDER — DIBUCAINE (PERIANAL) 1 % EX OINT: 1.0000 "application " | TOPICAL_OINTMENT | CUTANEOUS | Status: DC | PRN

## 2019-02-02 MED ORDER — WITCH HAZEL-GLYCERIN EX PADS: 1.0000 "application " | MEDICATED_PAD | CUTANEOUS | Status: DC | PRN

## 2019-02-02 MED ORDER — BENZOCAINE-MENTHOL 20-0.5 % EX AERO
1.0000 "application " | INHALATION_SPRAY | CUTANEOUS | Status: DC | PRN
  Administered 2019-02-02: 1 via TOPICAL
  Filled 2019-02-02: qty 56

## 2019-02-02 MED ORDER — IBUPROFEN 600 MG PO TABS
600.0000 mg | ORAL_TABLET | Freq: Four times a day (QID) | ORAL | Status: DC
  Administered 2019-02-02 – 2019-02-03 (×6): 600 mg via ORAL
  Filled 2019-02-02 (×6): qty 1

## 2019-02-02 MED ORDER — PRENATAL MULTIVITAMIN CH
1.0000 | ORAL_TABLET | Freq: Every day | ORAL | Status: DC
  Administered 2019-02-02 – 2019-02-03 (×2): 1 via ORAL
  Filled 2019-02-02 (×2): qty 1

## 2019-02-02 MED ORDER — ONDANSETRON HCL 4 MG/2ML IJ SOLN: 4.0000 mg | INTRAMUSCULAR | Status: DC | PRN

## 2019-02-02 MED ORDER — SENNOSIDES-DOCUSATE SODIUM 8.6-50 MG PO TABS
2.0000 | ORAL_TABLET | ORAL | Status: DC
  Administered 2019-02-02: 2 via ORAL
  Filled 2019-02-02: qty 2

## 2019-02-02 MED ORDER — DOCUSATE SODIUM 100 MG PO CAPS
100.0000 mg | ORAL_CAPSULE | Freq: Two times a day (BID) | ORAL | Status: DC
  Administered 2019-02-02 – 2019-02-03 (×2): 100 mg via ORAL
  Filled 2019-02-02 (×2): qty 1
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
## 2019-02-02 NOTE — Discharge Summary (Signed)
Obstetrics Discharge Summary [facility]   Patient Name: Tiwatope Emmitt DOB: 1990/08/19 MRN: [id number]  Date of admission: 02/01/2019 Delivering MD: [name] E   Date of discharge: 02/03/2019  Admitting diagnosis: pregnancy Intrauterine pregnancy: [redacted]w[redacted]d     Secondary diagnosis:   Principal Problem:   Pregnancy affected by fetal growth restriction Active Problems:   Hepatitis C   Group B Streptococcus carrier state affecting pregnancy   IUGR (intrauterine growth restriction) affecting care of mother, third trimester, fetus 1     Discharge diagnosis: Preterm Pregnancy Delivered                                            Postpartum procedures: None  Complications: none  Outpatient Follow-Up: [X]  continue to counsel on method of contraception - pt interested in LARC. Given list of options.  [ ]  behavioral health follow-up - history of ADHD, PTSD, IVDU (negative UDS on admission)   Hospital course: Peggyann Zwiefelhofer is a 28 y.o. [redacted]w[redacted]d who was admitted for induction of labor for fetal growth restriction. Her pregnancy was complicated by above noted. Her labor course was notable for induction with cytotec followed by AROM, epidural placement and progression to complete. Delivery was uncomplicated. Please see delivery/op note for additional details. Her postpartum course was uncomplicated. She was breastfeeding without difficulty. By day of discharge, she was passing flatus, urinating, eating and drinking without difficulty. Her pain was well-controlled, and she was discharged home with ibuprofen. She will follow-up in clinic in 6 weeks.   Physical exam  Vitals:   02/02/19 1509 02/02/19 1639 02/02/19 2144 02/03/19 0454  BP: (!) 92/53 103/60 (!) 101/53 99/63  Pulse: 62 67 (!) 53 (!) 51  Resp: 18 16 16 16   Temp: 98.3 F (36.8 C) 98.3 F (36.8 C) 98.3 F (36.8 C) 97.6 F (36.4 C)  TempSrc:   Oral   SpO2: 99% 100% 98% 98%  Weight:      Height:       General: appears well,  up ad lib  Lochia: appropriate Uterine Fundus: firm Incision: N/A DVT Evaluation: No evidence of DVT seen on physical exam.  Labs: Lab Results  Component Value Date   WBC 15.0 (H) 02/02/2019   HGB 9.8 (L) 02/02/2019   HCT 28.7 (L) 02/02/2019   MCV 97.3 02/02/2019   PLT 147 (L) 02/02/2019   CMP Latest Ref Rng & Units 01/07/2019  Glucose 65 - 99 mg/dL 69  BUN 6 - 20 mg/dL 7  Creatinine 0.57 - 1.00 mg/dL 0.41(L)  Sodium 134 - 144 mmol/L 138  Potassium 3.5 - 5.2 mmol/L 4.0  Chloride 96 - 106 mmol/L 102  CO2 20 - 29 mmol/L 19(L)  Calcium 8.7 - 10.2 mg/dL 8.8  Total Protein 6.0 - 8.5 g/dL 5.9(L)  Total Bilirubin 0.0 - 1.2 mg/dL <0.2  Alkaline Phos 39 - 117 IU/L 74  AST 0 - 40 IU/L 12  ALT 0 - 32 IU/L 7    Discharge instructions: Per After Visit Summary and "Baby and Me Booklet"  After visit meds:  Allergies as of 02/03/2019   No Known Allergies     Medication List    TAKE these medications   docusate sodium 100 MG capsule Commonly known as: COLACE Take 1 capsule (100 mg total) by mouth 2 (two) times daily.   ferrous sulfate 325 (65 FE) MG tablet Take  1 tablet (325 mg total) by mouth daily with breakfast.   ibuprofen 600 MG tablet Commonly known as: ADVIL Take 1 tablet (600 mg total) by mouth every 6 (six) hours.   omeprazole 20 MG capsule Commonly known as: PRILOSEC Take 20 mg by mouth daily.   prenatal vitamin w/FE, FA 27-1 MG Tabs tablet Take 1 tablet by mouth daily at 12 noon.   valACYclovir 500 MG tablet Commonly known as: VALTREX Take 1 tablet (500 mg total) by mouth 2 (two) times daily.       Postpartum contraception: interested in LARC, given list of options Diet: Routine Diet Activity: Advance as tolerated. Pelvic rest for 6 weeks.   Follow-up Appt: Future Appointments  Date Time Provider Department Center  03/03/2019 10:55 AM Armando ReichertHogan, Heather D, CNM [facility]  03/03/2019 11:30 AM [facility]   Follow-up  Visit:No follow-ups on file. Please schedule this patient for Postpartum visit in: 4 weeks with the following provider: Any provider High risk pregnancy complicated by: FGR, hx IVDU, HCV, HSV, anemia Delivery mode:  SVD Anticipated Birth Control:  other/unsure PP Procedures needed: none  Schedule Integrated BH visit: yes  Newborn Data: Live born female  Birth Weight: 4 lb 11.7 oz (2146 g) APGAR: 9, 9  Newborn Delivery   Birth date/time: [birth year]/2019 00:33:00 Delivery type: Vaginal, Spontaneous     Baby Feeding: Breast Disposition:home with mother    AMb referral to GI for HCV.   Judeth HornLawrence, Zaron Zwiefelhofer, NP

## 2019-02-02 NOTE — Lactation Note (Signed)
This note was copied from a baby's chart. Lactation Consultation Note  Patient Name: Amanda Buchanan [id number] Date: 02/02/2019 Reason for consult: Early term 37-38.6wks;Infant < 6lbs P2, 6 hour female infant, ETI and less than 5 lbs at birth. Per mom, she made 3 attempts of latching infant to breast. Infant has emesis with LC while in basinet frothy with curdle milk. Mom's current feeding choice is breastfeeding and supplementing with formula. Per mom, infant took 3 ml of Similac Neosure 22 kcal with iron. Per mom, infant last feed at 6:30 am took 3 ml of formula. LC did not observe a latch at this time. Mom has DEBP set up by Nurse but she not used it.  LC reviewed pump instructions and mom was pumping when LC left the room. Mom plans to breastfeed according hunger cues, 8 to 12 times within 24 hours and on demand. Mom will pump afterwards every 3 hours for 15 minutes. Mom will latch infant to breast , offer any EBM and then supplement with formula based on infant's age/ hours of life. Mom will do as much STS as possible. LC discussed I & O. Mom knows to call Nurse or Bee if she has any questions, concerns or need assistance with latching infant to breast. Mom made aware of O/P services, breastfeeding support groups, community resources, and our phone # for post-discharge questions.   Maternal Data Formula Feeding for Exclusion: No Has patient been taught Hand Expression?: Yes(Mom taught back hand expression 1 ml of colostrum)  Feeding Feeding Type: Bottle Fed - Formula Nipple Type: Slow - flow  LATCH Score Latch: Too sleepy or reluctant, no latch achieved, no sucking elicited.  Audible Swallowing: None  Type of Nipple: Everted at rest and after stimulation  Comfort (Breast/Nipple): Soft / non-tender  Hold (Positioning): Assistance needed to correctly position infant at breast and maintain latch.  LATCH Score: 5  Interventions Interventions: Breast feeding basics  reviewed;Skin to skin;DEBP;Hand express;Expressed milk  Lactation Tools Discussed/Used WIC Program: No Pump Review: Setup, frequency, and cleaning;Milk Storage Initiated by:: by Nurse   Consult Status Consult Status: Follow-up Date: 02/02/19 Follow-up type: In-patient    Vicente Serene 02/02/2019, 7:11 AM

## 2019-02-02 NOTE — Anesthesia Postprocedure Evaluation (Signed)
Anesthesia Post Note  Patient: Amanda Buchanan  Procedure(s) Performed: AN AD [place]     Patient location during evaluation: Mother Baby Anesthesia Type: Epidural Level of consciousness: awake and alert and oriented Pain management: satisfactory to patient Vital Signs Assessment: post-procedure vital signs reviewed and stable Respiratory status: respiratory function stable Cardiovascular status: stable Postop Assessment: no headache, no backache, epidural receding, patient able to bend at knees, no signs of nausea or vomiting and adequate PO intake Anesthetic complications: no  Pain JDBZM:0/80  Last Vitals:  Vitals:   02/02/19 0230 02/02/19 0330  BP: 110/68 112/69  Pulse: 76 95  Resp: 16 18  Temp: 37.9 C 37.4 C  SpO2: 100% 99%    Last Pain:  Vitals:   02/02/19 0330  TempSrc: Oral  PainSc: 2    Pain Goal:                   Kimoni Pagliarulo

## 2019-02-03 MED ORDER — MEDROXYPROGESTERONE ACETATE 150 MG/ML IM SUSP: 150.0000 mg | Freq: Once | INTRAMUSCULAR | Status: DC

## 2019-02-03 MED ORDER — DOCUSATE SODIUM 100 MG PO CAPS: 100.0000 mg | ORAL_CAPSULE | Freq: Two times a day (BID) | ORAL | 0 refills | Status: AC

## 2019-02-03 MED ORDER — IBUPROFEN 600 MG PO TABS: 600.0000 mg | ORAL_TABLET | Freq: Four times a day (QID) | ORAL | 0 refills | Status: AC

## 2019-02-03 NOTE — Discharge Instructions (Signed)
Vaginal Delivery, Care After °Refer to this sheet in the next few weeks. These instructions provide you with information about caring for yourself after vaginal delivery. Your health care provider may also give you more specific instructions. Your treatment has been planned according to current medical practices, but problems sometimes occur. Call your health care provider if you have any problems or questions. °What can I expect after the procedure? °After vaginal delivery, it is common to have: °· Some bleeding from your vagina. °· Soreness in your abdomen, your vagina, and the area of skin between your vaginal opening and your anus (perineum). °· Pelvic cramps. °· Fatigue. °Follow these instructions at home: °Medicines °· Take over-the-counter and prescription medicines only as told by your health care provider. °· If you were prescribed an antibiotic medicine, take it as told by your health care provider. Do not stop taking the antibiotic until it is finished. °Driving ° °· Do not drive or operate heavy machinery while taking prescription pain medicine. °· Do not drive for 24 hours if you received a sedative. °Lifestyle °· Do not drink alcohol. This is especially important if you are breastfeeding or taking medicine to relieve pain. °· Do not use tobacco products, including cigarettes, chewing tobacco, or e-cigarettes. If you need help quitting, ask your health care provider. °Eating and drinking °· Drink at least 8 eight-ounce glasses of water every day unless you are told not to by your health care provider. If you choose to breastfeed your baby, you may need to drink more water than this. °· Eat high-fiber foods every day. These foods may help prevent or relieve constipation. High-fiber foods include: °? Whole grain cereals and breads. °? Brown rice. °? Beans. °? Fresh fruits and vegetables. °Activity °· Return to your normal activities as told by your health care provider. Ask your health care provider what  activities are safe for you. °· Rest as much as possible. Try to rest or take a nap when your baby is sleeping. °· Do not lift anything that is heavier than your baby or 10 lb (4.5 kg) until your health care provider says that it is safe. °· Talk with your health care provider about when you can engage in sexual activity. This may depend on your: °? Risk of infection. °? Rate of healing. °? Comfort and desire to engage in sexual activity. °Vaginal Care °· If you have an episiotomy or a vaginal tear, check the area every day for signs of infection. Check for: °? More redness, swelling, or pain. °? More fluid or blood. °? Warmth. °? Pus or a bad smell. °· Do not use tampons or douches until your health care provider says this is safe. °· Watch for any blood clots that may pass from your vagina. These may look like clumps of dark red, brown, or black discharge. °General instructions °· Keep your perineum clean and dry as told by your health care provider. °· Wear loose, comfortable clothing. °· Wipe from front to back when you use the toilet. °· Ask your health care provider if you can shower or take a bath. If you had an episiotomy or a perineal tear during labor and delivery, your health care provider may tell you not to take baths for a certain length of time. °· Wear a bra that supports your breasts and fits you well. °· If possible, have someone help you with household activities and help care for your baby for at least a few days after you   leave the hospital.  Keep all follow-up visits for you and your baby as told by your health care provider. This is important. Contact a health care provider if:  You have: ? Vaginal discharge that has a bad smell. ? Difficulty urinating. ? Pain when urinating. ? A sudden increase or decrease in the frequency of your bowel movements. ? More redness, swelling, or pain around your episiotomy or vaginal tear. ? More fluid or blood coming from your episiotomy or vaginal  tear. ? Pus or a bad smell coming from your episiotomy or vaginal tear. ? A fever. ? A rash. ? Little or no interest in activities you used to enjoy. ? Questions about caring for yourself or your baby.  Your episiotomy or vaginal tear feels warm to the touch.  Your episiotomy or vaginal tear is separating or does not appear to be healing.  Your breasts are painful, hard, or turn red.  You feel unusually sad or worried.  You feel nauseous or you vomit.  You pass large blood clots from your vagina. If you pass a blood clot from your vagina, save it to show to your health care provider. Do not flush blood clots down the toilet without having your health care provider look at them.  You urinate more than usual.  You are dizzy or light-headed.  You have not breastfed at all and you have not had a menstrual period for 12 weeks after delivery.  You have stopped breastfeeding and you have not had a menstrual period for 12 weeks after you stopped breastfeeding. Get help right away if:  You have: ? Pain that does not go away or does not get better with medicine. ? Chest pain. ? Difficulty breathing. ? Blurred vision or spots in your vision. ? Thoughts about hurting yourself or your baby.  You develop pain in your abdomen or in one of your legs.  You develop a severe headache.  You faint.  You bleed from your vagina so much that you fill two sanitary pads in one hour. This information is not intended to replace advice given to you by your health care provider. Make sure you discuss any questions you have with your health care provider. Document Released: 07/21/2000 Document Revised: 01/05/2016 Document Reviewed: 08/08/2015 Elsevier Interactive Patient Education  2019 [employer]. Contraception Choices Contraception, also called birth control, refers to methods or devices that prevent pregnancy. Hormonal methods Contraceptive implant  A contraceptive implant is a thin, plastic  tube that contains a hormone. It is inserted into the upper part of the arm. It can remain in place for up to 3 years. Progestin-only injections Progestin-only injections are injections of progestin, a synthetic form of the hormone progesterone. They are given every 3 months by a health care provider. Birth control pills  Birth control pills are pills that contain hormones that prevent pregnancy. They must be taken once a day, preferably at the same time each day. Birth control patch  The birth control patch contains hormones that prevent pregnancy. It is placed on the skin and must be changed once a week for three weeks and removed on the fourth week. A prescription is needed to use this method of contraception. Vaginal ring  A vaginal ring contains hormones that prevent pregnancy. It is placed in the vagina for three weeks and removed on the fourth week. After that, the process is repeated with a new ring. A prescription is needed to use this method of contraception. Emergency contraceptive Emergency  contraceptives prevent pregnancy after unprotected sex. They come in pill form and can be taken up to 5 days after sex. They work best the sooner they are taken after having sex. Most emergency contraceptives are available without a prescription. This method should not be used as your only form of birth control. Barrier methods Female condom  A female condom is a thin sheath that is worn over the penis during sex. Condoms keep sperm from going inside a woman's body. They can be used with a spermicide to increase their effectiveness. They should be disposed after a single use. Female condom  A female condom is a soft, loose-fitting sheath that is put into the vagina before sex. The condom keeps sperm from going inside a woman's body. They should be disposed after a single use. Diaphragm  A diaphragm is a soft, dome-shaped barrier. It is inserted into the vagina before sex, along with a spermicide.  The diaphragm blocks sperm from entering the uterus, and the spermicide kills sperm. A diaphragm should be left in the vagina for 6-8 hours after sex and removed within 24 hours. A diaphragm is prescribed and fitted by a health care provider. A diaphragm should be replaced every 1-2 years, after giving birth, after gaining more than 15 lb (6.8 kg), and after pelvic surgery. Cervical cap  A cervical cap is a round, soft latex or plastic cup that fits over the cervix. It is inserted into the vagina before sex, along with spermicide. It blocks sperm from entering the uterus. The cap should be left in place for 6-8 hours after sex and removed within 48 hours. A cervical cap must be prescribed and fitted by a health care provider. It should be replaced every 2 years. Sponge  A sponge is a soft, circular piece of polyurethane foam with spermicide on it. The sponge helps block sperm from entering the uterus, and the spermicide kills sperm. To use it, you make it wet and then insert it into the vagina. It should be inserted before sex, left in for at least 6 hours after sex, and removed and thrown away within 30 hours. Spermicides Spermicides are chemicals that kill or block sperm from entering the cervix and uterus. They can come as a cream, jelly, suppository, foam, or tablet. A spermicide should be inserted into the vagina with an applicator at least 10-15 minutes before sex to allow time for it to work. The process must be repeated every time you have sex. Spermicides do not require a prescription. Intrauterine contraception Intrauterine device (IUD) An IUD is a T-shaped device that is put in a woman's uterus. There are two types:  Hormone IUD.This type contains progestin, a synthetic form of the hormone progesterone. This type can stay in place for 3-5 years.  Copper IUD.This type is wrapped in copper wire. It can stay in place for 10 years.  Permanent methods of contraception Female tubal  ligation In this method, a woman's fallopian tubes are sealed, tied, or blocked during surgery to prevent eggs from traveling to the uterus. Hysteroscopic sterilization In this method, a small, flexible insert is placed into each fallopian tube. The inserts cause scar tissue to form in the fallopian tubes and block them, so sperm cannot reach an egg. The procedure takes about 3 months to be effective. Another form of birth control must be used during those 3 months. Female sterilization This is a procedure to tie off the tubes that carry sperm (vasectomy). After the procedure, the man  can still ejaculate fluid (semen). Natural planning methods Natural family planning In this method, a couple does not have sex on days when the woman could become pregnant. Calendar method This means keeping track of the length of each menstrual cycle, identifying the days when pregnancy can happen, and not having sex on those days. Ovulation method In this method, a couple avoids sex during ovulation. Symptothermal method This method involves not having sex during ovulation. The woman typically checks for ovulation by watching changes in her temperature and in the consistency of cervical mucus. Post-ovulation method In this method, a couple waits to have sex until after ovulation. Summary  Contraception, also called birth control, means methods or devices that prevent pregnancy.  Hormonal methods of contraception include implants, injections, pills, patches, vaginal rings, and emergency contraceptives.  Barrier methods of contraception can include female condoms, female condoms, diaphragms, cervical caps, sponges, and spermicides.  There are two types of IUDs (intrauterine devices). An IUD can be put in a woman's uterus to prevent pregnancy for 3-5 years.  Permanent sterilization can be done through a procedure for males, females, or both.  Natural family planning methods involve not having sex on days when  the woman could become pregnant. This information is not intended to replace advice given to you by your health care provider. Make sure you discuss any questions you have with your health care provider. Document Released: 07/24/2005 Document Revised: 07/26/2017 Document Reviewed: 08/26/2016 Elsevier Patient Education  2020 [employer]

## 2019-02-03 NOTE — Lactation Note (Addendum)
This note was copied from a baby's chart. Lactation Consultation Note  Patient Name: Amanda Buchanan [id number] Date: 02/03/2019   Infant was observed at breast. Mom's breasts are filling. Infant was not active at breast, but readily swallowed with breast compressions. B/c of infant's small size, a nipple shield was tried to see if that would allow infant to be more active at the breast. A nipple shield was applied & Mom was able to express her milk into the nipple shield, but infant had already fallen asleep & didn't seem interested.   I noticed that Mom had been using the Nfant slow-flow nipple. I explained to Mom that those nipples could be used for 24 hrs each while in the hospital. Mom permitted me to try & use the Enfamil Extra Slow-Flow nipple & infant did well.   I assisted Mom with using a DEBP with size 21 flanges which seem to be appropriate at this time. Mom was able to express a few mL, which Mom says is the most she has seen. Mom understands that she can go to size 24 flanges if she ever feels that her nipples need more room when pumping. Mom was shown how to wash pump parts. Mom is interested in the [facility] loaner option & will talk to her husband.   Plan: Mom will continue to offer breast (with or without nipple shield, per Mom's choice). However, after each feeding at breast, f/u with bottle to ensure that infant is getting enough (until infant proves she has become efficient at feeding at the breast).   Matthias Hughs [facility] 02/03/2019, 2:23 PM

## 2019-02-03 NOTE — Lactation Note (Signed)
This note was copied from a baby's chart. Lactation Consultation Note Baby 51 hrs old. Mom is BF then supplementing w/22 cal. Similac. Gave mom LPI feeding guide to increase supplemental amount. Reviewed care for baby less than 5 lbs. Reviewed not feeding longer than 30 min. At a time. Baby is cluster feeding. Mom holding baby STS. No hat or blanket across baby. Mom stated baby's temp. Has been good. Discuss wearing a hat to prevent heat loss.  Mom has been using DEBP. States getting nothing out but she can hand express colostrum. Encouraged mom to pump and hand express afterwards. Hand pump given for pre-pumping prior to BF d/t semi flat nipples. LC has concerns than when breast become full nipples will be flat and baby will not be able to feed. Asked mom to call for assistance in BF if needed. LC needs to see a latch. Shells given for mom to wear to assist in everting nipples.  Mom also has Hx: of Hep. C. Discussed w/mom if she see's cracked nipples or blood to pump and dump and not latch baby until heals.  Mom stated that she is negative for Hep. C that she is cured. I congratulated mom and encouraged mom to ask Dr. If she need to have caution.  Praised mom for doing such a good job BF and supplementing. Encouraged to call for assistance or questions.  Patient Name: Girl Winfred Redel VCBSW'H Date: 02/03/2019 Reason for consult: Follow-up assessment;Infant < 6lbs;1st time breastfeeding;Early term 37-38.6wks   Maternal Data Has patient been taught Hand Expression?: Yes Does the patient have breastfeeding experience prior to this delivery?: No  Feeding Feeding Type: Breast Fed  LATCH Score Latch: Grasps breast easily, tongue down, lips flanged, rhythmical sucking.  Audible Swallowing: A few with stimulation  Type of Nipple: Flat(semi flat)  Comfort (Breast/Nipple): Filling, red/small blisters or bruises, mild/mod discomfort(a little sore/intact)  Hold (Positioning):  Assistance needed to correctly position infant at breast and maintain latch.  LATCH Score: 8  Interventions Interventions: Breast feeding basics reviewed;Skin to skin;Hand express;Pre-pump if needed;Shells;Hand pump;Breast compression;DEBP  Lactation Tools Discussed/Used Tools: Shells;Pump;Coconut oil Shell Type: Inverted Breast pump type: Double-Electric Breast Pump WIC Program: No Pump Review: Setup, frequency, and cleaning;Milk Storage Initiated by:: RN   Consult Status Consult Status: Follow-up(LC needs to see latch) Date: 02/03/19 Follow-up type: In-patient    Theodoro Kalata 02/03/2019, 12:28 AM

## 2019-02-04 ENCOUNTER — Ambulatory Visit: Payer: Self-pay

## 2019-02-04 NOTE — Lactation Note (Addendum)
This note was copied from a baby's chart. Lactation Consultation Note  Patient Name: Amanda Buchanan [id number] Date: 02/04/2019   Baby 54 hours old.  < 5 lbs.  Mother states she is primarily pumping and bottle feeding.  Purple nipple is working well.   Mother is pumping approx 40 ml and baby is getting 15-20 ml. Reviewed volume guidelines increasing per day of life and as baby desires. Provided mother information to rent breast pump from gift shop. Mother's breasts are filling.  Discussed how breastmilk comes to volume. Discussed breast compression/massage while pumping to fulling empty breast and apply ice if needed.  Reviewed pumping frequency suggesting 2.5 hours during the day and 4 hours at night.  Encouraged STS and allowing baby to nuzzle/short feedings at breast so when she is bigger she can transition which is mother's desire. Discussed OP appt if desired later when needed to latch. Reviewed engorgement care and monitoring voids/stools. Stools transitioning.  Reviewed milk storage.       Maternal Data    Feeding Feeding Type: Bottle Fed - Breast Milk  LATCH Score                   Interventions    Lactation Tools Discussed/Used     Consult Status      Carlye Grippe 02/04/2019, 9:17 AM

## 2019-02-10 LAB — RPR: RPR Ser Ql: NONREACTIVE

## 2019-02-10 LAB — HEPATITIS C ANTIBODY: HCV Ab: >11 s/co ratio — ABNORMAL HIGH (ref 0.0–0.9)

## 2019-03-03 ENCOUNTER — Encounter: Payer: Self-pay | Admitting: Advanced Practice Midwife

## 2019-03-03 ENCOUNTER — Other Ambulatory Visit: Payer: Self-pay

## 2019-03-03 ENCOUNTER — Telehealth (INDEPENDENT_AMBULATORY_CARE_PROVIDER_SITE_OTHER): Payer: Medicaid Other | Admitting: Clinical

## 2019-03-03 ENCOUNTER — Telehealth (INDEPENDENT_AMBULATORY_CARE_PROVIDER_SITE_OTHER): Payer: Medicaid Other | Admitting: Advanced Practice Midwife

## 2019-03-03 DIAGNOSIS — Z8759 Personal history of other complications of pregnancy, childbirth and the puerperium: Secondary | ICD-10-CM

## 2019-03-03 DIAGNOSIS — Z8659 Personal history of other mental and behavioral disorders: Secondary | ICD-10-CM

## 2019-03-03 MED ORDER — NORETHINDRONE 0.35 MG PO TABS: 1.0000 | ORAL_TABLET | Freq: Every day | ORAL | 11 refills | Status: AC

## 2019-03-03 NOTE — Progress Notes (Signed)
Subjective:       Plan:    . I connected with  Scottie Stanish on 03/03/19 at 10:55 AM EDT by telephone and verified that I am speaking with the correct person using two identifiers.   I discussed the limitations, risks, security and privacy concerns of performing an evaluation and management service by telephone and virtually and the availability of in person appointments. I also discussed with the patient that there may be a patient responsible charge related to this service. The patient expressed understanding and agreed to proceed.  Genette Huertas,RN 03/03/2019  10:41 AM

## 2019-03-03 NOTE — Progress Notes (Signed)
TELEHEALTH VIRTUAL POSTPARTUM VISIT ENCOUNTER NOTE  I connected with Amanda Buchanan  on 03/03/19 at 10:55 AM EDT by telephone at home and verified that I am speaking with the correct person using two identifiers.   I discussed the limitations, risks, security and privacy concerns of performing an evaluation and management service by telephone and the availability of in person appointments. I also discussed with the patient that there may be a patient responsible charge related to this service. The patient expressed understanding and agreed to proceed.  Appointment Date: 03/03/2019  [facility]N Clinic: [facility]   Chief Complaint:  Postpartum Visit  History of Present Illness: Amanda Buchanan is a 28 y.o. Caucasian [id number] (No LMP recorded.), seen for the above chief complaint. Her past medical history is significant for Hep C   She is s/p normal spontaneous vaginal delivery on 02/01/2201 at 38 weeks; she was discharged to home on PPD#1. Pregnancy complicated by IUGR. Baby is doing well.  Complains of None  Vaginal bleeding or discharge: No  Mode of feeding infant: Breast Intercourse: No  Contraception: LAM PP depression s/s: No .  Any bowel or bladder issues: No  Pap smear: no abnormalities (date: 2018)  Review of Systems: Her 12 point review of systems is negative or as noted in the History of Present Illness.  Patient Active Problem List   Diagnosis Date Noted  . IUGR (intrauterine growth restriction) affecting care of mother, third trimester, fetus 1 02/01/2019  . 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 Hepatitis C 12/31/2018  . Pregnancy affected by fetal growth restriction 12/26/2018  . Supervision of other normal pregnancy, antepartum 11/25/2018    Medications Amanda Buchanan had no medications administered during this visit. Current Outpatient Medications  Medication Sig Dispense Refill  . Fenugreek 610 MG CAPS Take 1 capsule by mouth daily.    Marland Kitchen  ibuprofen (ADVIL) 600 MG tablet Take 1 tablet (600 mg total) by mouth every 6 (six) hours. 30 tablet 0  . prenatal vitamin w/FE, FA (PRENATAL 1 + 1) 27-1 MG TABS tablet Take 1 tablet by mouth daily at 12 noon. 30 each 5  . docusate sodium (COLACE) 100 MG capsule Take 1 capsule (100 mg total) by mouth 2 (two) times daily. 10 capsule 0  . ferrous sulfate 325 (65 FE) MG tablet Take 1 tablet (325 mg total) by mouth daily with breakfast. 30 tablet 3  . omeprazole (PRILOSEC) 20 MG capsule Take 20 mg by mouth daily.    . valACYclovir (VALTREX) 500 MG tablet Take 1 tablet (500 mg total) by mouth 2 (two) times daily. (Patient not taking: Reported on 03/03/2019) 60 tablet 6   No current facility-administered medications for this visit.     Allergies Patient has no known allergies.  Physical Exam:  General:  Alert, oriented and cooperative.   Mental Status: Normal mood and affect perceived. Normal judgment and thought content.  Rest of physical exam deferred due to type of encounter  PP Depression Screening:   Edinburgh Postnatal Depression Scale - 03/03/19 1051      Edinburgh Postnatal Depression Scale:  In the Past 7 Days   I have been able to laugh and see the funny side of things.  0    I have looked forward with enjoyment to things.  0    I have blamed myself unnecessarily when things went wrong.  0    I have been anxious or worried for no good reason.  0  I have felt scared or panicky for no good reason.  0    Things have been getting on top of me.  0    I have been so unhappy that I have had difficulty sleeping.  0    I have felt sad or miserable.  0    I have been so unhappy that I have been crying.  0    The thought of harming myself has occurred to me.  0    Edinburgh Postnatal Depression Scale Total  0       Assessment:Patient is a 28 y.o. [id number] who is 4 weeks postpartum from a normal spontaneous vaginal delivery.  She is doing well.   Plan: 1. Postpartum care and  examination    RX Micronor #1 with 11 RF Patient pumping and breastfeeding. She is having to supplement. She would like to see lactation   RTC [facility]   I discussed the assessment and treatment plan with the patient. The patient was provided an opportunity to ask questions and all were answered. The patient agreed with the plan and demonstrated an understanding of the instructions.   The patient was advised to call back or seek an in-person evaluation/go to the ED for any concerning postpartum symptoms.  I provided 15 minutes of non-face-to-face time during this encounter.  Thressa ShellerHeather  DNP, CNM  03/03/19  10:59 AM  [facility]

## 2019-03-03 NOTE — BH Specialist Note (Signed)
Integrated Behavioral Health via Telemedicine Video Visit  03/03/2019 Amanda Buchanan [id number]  Number of [place] visits: 1 Session Start time: 11:34  Session End time: 11:58 Total time: 20 minutes  Referring Provider: Marcille Buchanan, CNM Type of Visit: Video Patient/Family location: Home  [facility] Provider location: [facility] All persons participating in visit: Patient Amanda Buchanan and Amanda Buchanan  Confirmed patient's address: Yes  Confirmed patient's phone number: Yes  Any changes to demographics: No   Confirmed patient's insurance: Yes  Any changes to patient's insurance: No   Discussed confidentiality: Yes   I connected with Amanda Buchanan and/or Amanda Buchanan Newborn daughter by a video enabled telemedicine application and verified that I am speaking with the correct person using two identifiers.     I discussed the limitations of evaluation and management by telemedicine and the availability of in person appointments.  I discussed that the purpose of this visit is to provide behavioral health care while limiting exposure to the novel coronavirus.   Discussed there is a possibility of technology failure and discussed alternative modes of communication if that failure occurs.  I discussed that engaging in this video visit, they consent to the provision of behavioral healthcare and the services will be billed under their insurance.  Patient and/or legal guardian expressed understanding and consented to video visit: Yes   PRESENTING CONCERNS: Patient and/or family reports the following symptoms/concerns: Pt states her primary concern was a history of depression after her 2yo daughter was born, but says she feels much better after this birth, and is not currently having symptoms of depression or anxiety. Pt feels well supported by friends and family, breastfeeding is going well. Pt would like to improve sleep, and open to suggestions.  Duration of problem:  Postpartum; Severity of problem: n/a   STRENGTHS (Protective Factors/Coping Skills): Strong social support  GOALS ADDRESSED: Patient will: 1.  Maintain reduction of symptoms of: depression  2.  Increase knowledge and/or ability of: healthy habits  3.  Demonstrate ability to: Increase healthy adjustment to current life circumstances and Increase adequate support systems for patient/family  INTERVENTIONS: Interventions utilized:  Psychoeducation and/or Health Education and Link to [employer] Standardized Assessments completed: Amanda Buchanan Postnatal Depression  ASSESSMENT: Patient currently experiencing History of depression postpartum  Patient may benefit from psychoeducation and brief therapeutic interventions regarding maintaining reduction of  symptoms of depression .  PLAN: 1. Follow up with behavioral health clinician on : As needed, if symptoms of depression or anxiety begin to increase 2. Behavioral recommendations:  -Continue taking iron pill, as prescribed by medical provider  -Take a timed 20 minute nap after morning coffee, during baby's morning nap; continue to sleep when baby sleeps, as able -Continue using sleep sounds at night(switch to every single night, for as long as remains helpful) -Consider registering for and attending at least one new mom support group, via either [email or web address] or [email or web address].net 3. Referral(s): Integrated Orthoptist (In Clinic) and [employer] Resources:  New mom support  I discussed the assessment and treatment plan with the patient and/or parent/guardian. They were provided an opportunity to ask questions and all were answered. They agreed with the plan and demonstrated an understanding of the instructions.   They were advised to call back or seek an in-person evaluation if the symptoms worsen or if the condition fails to improve as anticipated.  Amanda Buchanan

## 2019-10-14 ENCOUNTER — Other Ambulatory Visit: Payer: Self-pay | Admitting: Lactation Services

## 2019-10-14 MED ORDER — PRENATAL PLUS 27-1 MG PO TABS: 1.0000 | ORAL_TABLET | Freq: Every day | ORAL | 11 refills | Status: AC

## 2019-10-21 ENCOUNTER — Telehealth (INDEPENDENT_AMBULATORY_CARE_PROVIDER_SITE_OTHER): Payer: Medicaid Other | Admitting: Lactation Services

## 2019-10-21 DIAGNOSIS — Z3480 Encounter for supervision of other normal pregnancy, unspecified trimester: Secondary | ICD-10-CM

## 2019-10-21 NOTE — Telephone Encounter (Signed)
Called patient to inform her that prescription was sent to [facility] in [place]. Patient reports she is not in [place] and is pregnant again.  Informed her that [facility] can call and have prescription transferred. Patient voiced understanding.
